# Patient Record
Sex: Female | Born: 1935 | Race: White | Marital: Married | Smoking: Never smoker
Health system: Southern US, Community
[De-identification: ages and names within clinical notes are randomized; demographics above are authoritative.]

## PROBLEM LIST (undated history)

## (undated) DIAGNOSIS — E785 Hyperlipidemia, unspecified: Secondary | ICD-10-CM

## (undated) DIAGNOSIS — M5416 Radiculopathy, lumbar region: Secondary | ICD-10-CM

## (undated) DIAGNOSIS — I10 Essential (primary) hypertension: Secondary | ICD-10-CM

## (undated) DIAGNOSIS — K589 Irritable bowel syndrome without diarrhea: Secondary | ICD-10-CM

## (undated) HISTORY — DX: Radiculopathy, lumbar region: M54.16

## (undated) HISTORY — DX: Hyperlipidemia, unspecified: E78.5

## (undated) HISTORY — DX: Essential (primary) hypertension: I10

## (undated) HISTORY — DX: Irritable bowel syndrome, unspecified: K58.9

---

## 2015-10-10 ENCOUNTER — Ambulatory Visit (INDEPENDENT_AMBULATORY_CARE_PROVIDER_SITE_OTHER): Payer: Medicare Other | Admitting: Gastroenterology

## 2015-10-10 ENCOUNTER — Other Ambulatory Visit (INDEPENDENT_AMBULATORY_CARE_PROVIDER_SITE_OTHER): Payer: Medicare Other

## 2015-10-10 ENCOUNTER — Encounter: Payer: Self-pay | Admitting: Gastroenterology

## 2015-10-10 VITALS — BP 130/66 | HR 76 | Ht 63.0 in | Wt 105.0 lb

## 2015-10-10 DIAGNOSIS — Z8719 Personal history of other diseases of the digestive system: Secondary | ICD-10-CM

## 2015-10-10 DIAGNOSIS — R109 Unspecified abdominal pain: Secondary | ICD-10-CM | POA: Diagnosis not present

## 2015-10-10 LAB — CBC WITH DIFFERENTIAL/PLATELET
BASOS ABS: 0 10*3/uL (ref 0.0–0.1)
BASOS PCT: 0.4 % (ref 0.0–3.0)
Eosinophils Absolute: 0.2 10*3/uL (ref 0.0–0.7)
Eosinophils Relative: 2.2 % (ref 0.0–5.0)
HEMATOCRIT: 36.8 % (ref 36.0–46.0)
Hemoglobin: 12.2 g/dL (ref 12.0–15.0)
LYMPHS PCT: 30.6 % (ref 12.0–46.0)
Lymphs Abs: 2.1 10*3/uL (ref 0.7–4.0)
MCHC: 33.2 g/dL (ref 30.0–36.0)
MCV: 90.4 fl (ref 78.0–100.0)
MONOS PCT: 7.6 % (ref 3.0–12.0)
Monocytes Absolute: 0.5 10*3/uL (ref 0.1–1.0)
NEUTROS ABS: 4.1 10*3/uL (ref 1.4–7.7)
Neutrophils Relative %: 59.2 % (ref 43.0–77.0)
PLATELETS: 181 10*3/uL (ref 150.0–400.0)
RBC: 4.07 Mil/uL (ref 3.87–5.11)
RDW: 14.2 % (ref 11.5–15.5)
WBC: 6.9 10*3/uL (ref 4.0–10.5)

## 2015-10-10 LAB — COMPREHENSIVE METABOLIC PANEL
ALT: 15 U/L (ref 0–35)
AST: 17 U/L (ref 0–37)
Albumin: 4.4 g/dL (ref 3.5–5.2)
Alkaline Phosphatase: 52 U/L (ref 39–117)
BUN: 16 mg/dL (ref 6–23)
CHLORIDE: 102 meq/L (ref 96–112)
CO2: 31 meq/L (ref 19–32)
CREATININE: 0.76 mg/dL (ref 0.40–1.20)
Calcium: 9.9 mg/dL (ref 8.4–10.5)
GFR: 77.43 mL/min (ref >60.00)
Glucose, Bld: 101 mg/dL — ABNORMAL HIGH (ref 70–99)
POTASSIUM: 4.3 meq/L (ref 3.5–5.1)
SODIUM: 139 meq/L (ref 135–145)
Total Bilirubin: 0.5 mg/dL (ref 0.2–1.2)
Total Protein: 6.9 g/dL (ref 6.0–8.3)

## 2015-10-10 LAB — IGA: IgA: 244 mg/dL (ref 68–378)

## 2015-10-10 MED ORDER — HYOSCYAMINE SULFATE ER 0.375 MG PO TB12: 0.3750 mg | ORAL_TABLET | Freq: Two times a day (BID) | ORAL | Status: DC

## 2015-10-10 NOTE — Patient Instructions (Addendum)
Start levbid .0375mg  one pill twice daily. You will have labs checked today in the basement lab.  Please head down after you check out with the front desk  (cbc, cmet, tTG level, total IgA). Ranitidine 75mg  pill, one pill at bedtime. Please return to see Dr. Christella HartiganJacobs on 12/11/15 at 8:45 am, sooner if needed.

## 2015-10-10 NOTE — Progress Notes (Signed)
HPI: This is a    very pleasant 82 year old woman whom I meeting for the first time today.  Chief complaint is chronic abdominal pains  abd pains. Periumbilical.  Burning and sometimes shooting pains.  More on her left side, especially under left ribs.  These pains have been going on at least 25-30 years.    Daughter is here with her.    She has eating problems.  Cannot help broccoilli, cauliflower, turnups.  Her weight has been down a bit.  Had the flu last year.  HTN in November.  She has had colonoscopy many years ago.  EGD also many years (10-11 years).  She has BMs without much trouble.  Never seen blood in her stool.  No colon cancer in her family.  Takes ranitidine PRN for the pains and it usually helps.  75mg  dosing works well.  Daughter says she can only eat very plain foods, no fried foods.  Her diet is very limited.  She brought US   She was told she had IBS a long time ago.  05/2015 US essentially normal (gallblader was normal).  Review of systems: Pertinent positive and negative review of systems were noted in the above HPI section. Complete review of systems was performed and was otherwise normal.   History reviewed. No pertinent past medical history.  History reviewed. No pertinent past surgical history.  Current Outpatient Prescriptions  Medication Sig Dispense Refill  . atorvastatin (LIPITOR) 10 MG tablet     . Calcium-Magnesium-Zinc 167-83-8 MG TABS Take by mouth daily.    . carvedilol (COREG) 12.5 MG tablet     . Cholecalciferol (VITAMIN D3) 5000 units CAPS Take 1 capsule by mouth daily.    . clonazePAM (KLONOPIN) 0.5 MG tablet      No current facility-administered medications for this visit.    Allergies as of 10/10/2015 - Review Complete 10/10/2015  Allergen Reaction Noted  . Aspirin Shortness Of Breath 10/10/2015  . Penicillins Swelling 10/10/2015    History reviewed. No pertinent family history.  Social History   Social History   . Marital Status: Married    Spouse Name: N/A  . Number of Children: 2  . Years of Education: N/A   Occupational History  . Retired    Social History Main Topics  . Smoking status: Never Smoker   . Smokeless tobacco: Never Used  . Alcohol Use: No  . Drug Use: No  . Sexual Activity: Not on file   Other Topics Concern  . Not on file   Social History Narrative  . No narrative on file     Physical Exam: BP 130/66 mmHg  Pulse 76  Ht 5\' 3"  (1.6 m)  Wt 105 lb (47.628 kg)  BMI 18.60 kg/m2 Constitutional: generally well-appearing Psychiatric: alert and oriented x3 Eyes: extraocular movements intact Mouth: oral pharynx moist, no lesions Neck: supple no lymphadenopathy Cardiovascular: heart regular rate and rhythm Lungs: clear to auscultation bilaterally Abdomen: soft, nontender, nondistended, no obvious ascites, no peritoneal signs, normal bowel sounds Extremities: no lower extremity edema bilaterally Skin: no lesions on visible extremities   Assessment and plan: 82 y.o. female with  Chronic abdominal pains, 25-30 years  She has had IBS-like pains with some potential GERD overlap for the past 25-30 years.   Sublingual antispasmodics seem to help very well. H2 blockers can help as well. I recommended some modifications to her medicines. She is going to begin twice daily pill form antispasmodic medicine. She is going to change her  H2 blocker to nightly dosing rather than when necessary only. We'll get a basic set of labs to a clinic CBC, complete about profile, celiac sprue testing. She will return to see me in 2-3 months and sooner if needed.   Rob Bunting, MD [facility] 10/10/2015, 1:37 PM

## 2015-10-11 LAB — TISSUE TRANSGLUTAMINASE, IGA: Tissue Transglutaminase Ab, IgA: 1 U/mL (ref <4)

## 2015-10-12 ENCOUNTER — Telehealth: Payer: Self-pay | Admitting: Gastroenterology

## 2015-10-12 NOTE — Telephone Encounter (Signed)
Pt aware to try miralax daily and adjust accordingly.  Pt agreed and will call if she has further problems

## 2015-10-17 ENCOUNTER — Telehealth: Payer: Self-pay | Admitting: Gastroenterology

## 2015-10-17 NOTE — Telephone Encounter (Signed)
She should stop the levbid.

## 2015-10-17 NOTE — Telephone Encounter (Signed)
Wants to try another medication. Understands that she may have the same side effects. What do you recommend?

## 2015-10-17 NOTE — Telephone Encounter (Signed)
Daughter states the side effects of the Levebid for her mother are burning tongue and an extremely dry mouth. She "has pain in her stomach to the point that she passes out". It is felt the Levebid does not make the pain of her IBS less. And she does not like the side effects.

## 2015-10-18 ENCOUNTER — Other Ambulatory Visit: Payer: Self-pay

## 2015-10-18 ENCOUNTER — Telehealth: Payer: Self-pay | Admitting: Gastroenterology

## 2015-10-18 MED ORDER — CILIDINIUM-CHLORDIAZEPOXIDE 2.5-5 MG PO CAPS: 1.0000 | ORAL_CAPSULE | Freq: Three times a day (TID) | ORAL | Status: DC

## 2015-10-18 NOTE — Telephone Encounter (Signed)
The pharmacy has been notified that generic is ok to dispence.

## 2015-10-18 NOTE — Telephone Encounter (Signed)
She can try librax 1 cap po qid before meals and at bedtime.  Disp 120, 3 refills.  Thanks

## 2015-10-18 NOTE — Telephone Encounter (Signed)
Discussed with the daughter. Willing to try the medication. [facility]

## 2015-12-11 ENCOUNTER — Ambulatory Visit: Payer: Medicare Other | Admitting: Gastroenterology

## 2015-12-19 ENCOUNTER — Ambulatory Visit: Payer: Medicare Other | Admitting: Gastroenterology

## 2018-05-01 ENCOUNTER — Other Ambulatory Visit: Payer: Self-pay | Admitting: Neurosurgery

## 2018-05-05 ENCOUNTER — Encounter: Payer: Self-pay | Admitting: Neurology

## 2018-05-05 ENCOUNTER — Telehealth: Payer: Self-pay | Admitting: Neurology

## 2018-05-05 ENCOUNTER — Ambulatory Visit (INDEPENDENT_AMBULATORY_CARE_PROVIDER_SITE_OTHER): Payer: Medicare Other | Admitting: Neurology

## 2018-05-05 VITALS — Ht 63.0 in | Wt 104.0 lb

## 2018-05-05 DIAGNOSIS — G459 Transient cerebral ischemic attack, unspecified: Secondary | ICD-10-CM | POA: Diagnosis not present

## 2018-05-05 DIAGNOSIS — M5416 Radiculopathy, lumbar region: Secondary | ICD-10-CM | POA: Insufficient documentation

## 2018-05-05 DIAGNOSIS — E782 Mixed hyperlipidemia: Secondary | ICD-10-CM

## 2018-05-05 DIAGNOSIS — K588 Other irritable bowel syndrome: Secondary | ICD-10-CM

## 2018-05-05 DIAGNOSIS — K589 Irritable bowel syndrome without diarrhea: Secondary | ICD-10-CM | POA: Insufficient documentation

## 2018-05-05 DIAGNOSIS — E785 Hyperlipidemia, unspecified: Secondary | ICD-10-CM | POA: Insufficient documentation

## 2018-05-05 DIAGNOSIS — R7309 Other abnormal glucose: Secondary | ICD-10-CM

## 2018-05-05 DIAGNOSIS — I10 Essential (primary) hypertension: Secondary | ICD-10-CM | POA: Diagnosis not present

## 2018-05-05 NOTE — Progress Notes (Signed)
[facility]    Provider:  Dr Lucia Gaskins Referring Provider: Bedelia Person, MD, Donalee Citrin MD Primary Care Physician:  Bedelia Person, MD  CC:  Monocular vision loss  HPI:  Ann Davidson is a 84 y.o. female here as requested by Dr. Wynetta Emery for monocular vision loss. She is here with her family who provides much information. PHx HTN, IBS, HLD 3 weeks ago she was at the breakfast table and she was just sitting there and she lost vision in her left eye. It was a white plate with zig zags and then her whole vision went black and jagged edges. No history of migraines. She has had vertiginous problems. ASA causes shortness of breath. The episode lasted 15 minutes but not sure. She couldn't see her husband face. Only in the left eye. She called her eye doctor, she just had a check up. She was seen immediately by the eye doctor. No other focal neurologic deficits, associated symptoms, inciting events or modifiable factors.  Reviewed notes, labs and imaging from outside physicians, which showed:  Reviewed doppler study and values, < 50% carotid stenosis bilaterally.   Will request Dr. Lonie Peak notes  Review of Systems: Patient complains of symptoms per HPI as well as the following symptoms: memory loss, dizziness, numbness. Pertinent negatives and positives per HPI. All others negative.   Social History   Socioeconomic History  . Marital status: Married    Spouse name: Not on file  . Number of children: 2  . Years of education: 51  . Highest education level: High school graduate  Occupational History  . Occupation: Retired  Engineer, production  . Financial resource strain: Not on file  . Food insecurity:    Worry: Not on file    Inability: Not on file  . Transportation needs:    Medical: Not on file    Non-medical: Not on file  Tobacco Use  . Smoking status: Never Smoker  . Smokeless tobacco: Never Used  Substance and Sexual Activity  . Alcohol use: No    Alcohol/week: 0.0  standard drinks  . Drug use: No  . Sexual activity: Not on file  Lifestyle  . Physical activity:    Days per week: Not on file    Minutes per session: Not on file  . Stress: Not on file  Relationships  . Social connections:    Talks on phone: Not on file    Gets together: Not on file    Attends religious service: Not on file    Active member of club or organization: Not on file    Attends meetings of clubs or organizations: Not on file    Relationship status: Not on file  . Intimate partner violence:    Fear of current or ex partner: Not on file    Emotionally abused: Not on file    Physically abused: Not on file    Forced sexual activity: Not on file  Other Topics Concern  . Not on file  Social History Narrative   Lives at home with her husband    Right handed   Caffeine: decaf tea & coffee    Family History  Problem Relation Age of Onset  . Heart attack Mother   . Heart attack Brother        problem with anesthesia (colon never "woke up")  . Other Brother        problem with anesthesia   . Cancer Brother  stomach    Past Medical History:  Diagnosis Date  . HLD (hyperlipidemia)   . Hypertension   . Irritable bowel syndrome   . Lumbar radiculopathy     History reviewed. No pertinent surgical history.  Current Outpatient Medications  Medication Sig Dispense Refill  . acetaminophen (TYLENOL) 500 MG tablet Take 1,000 mg by mouth 2 (two) times daily as needed for moderate pain.    Marland Kitchen acetaminophen-codeine (TYLENOL #3) 300-30 MG tablet Take 1 tablet by mouth at bedtime as needed for moderate pain.    Marland Kitchen amLODipine (NORVASC) 5 MG tablet Take 5 mg by mouth daily as needed (if bp is over 170).   3  . atorvastatin (LIPITOR) 10 MG tablet Take 10 mg by mouth daily.     . calcium carbonate (TUMS - DOSED IN MG ELEMENTAL CALCIUM) 500 MG chewable tablet Chew 2 tablets by mouth 2 (two) times daily as needed for indigestion or heartburn.    . carvedilol (COREG) 25 MG  tablet Take 12.5 mg by mouth 2 (two) times daily.    . famotidine (PEPCID AC) 10 MG tablet Take 10 mg by mouth daily as needed for heartburn or indigestion.    . hyoscyamine (ANASPAZ) 0.125 MG TBDP disintergrating tablet Take 0.125 mg by mouth 2 (two) times daily as needed for spasms.  2  . Multiple Vitamins-Minerals (PRESERVISION AREDS PO) Take 1 tablet by mouth 2 (two) times daily.    Marland Kitchen trolamine salicylate (ASPERCREME) 10 % cream Apply 1 application topically as needed for muscle pain.     No current facility-administered medications for this visit.     Allergies as of 05/05/2018 - Review Complete 05/05/2018  Allergen Reaction Noted  . Aspirin Shortness Of Breath 10/10/2015  . Codeine  05/05/2018  . Cyclobenzaprine  05/05/2018  . Penicillins Swelling 10/10/2015    Vitals: Ht 5\' 3"  (1.6 m)   Wt 104 lb (47.2 kg)   BMI 18.42 kg/m  Last Weight:  Wt Readings from Last 1 Encounters:  05/05/18 104 lb (47.2 kg)   Last Height:   Ht Readings from Last 1 Encounters:  05/05/18 5\' 3"  (1.6 m)    Physical exam: Exam: Gen: NAD, conversant, thin                    CV: RRR, no MRG. No Carotid Bruits. No peripheral edema, warm, nontender Eyes: Conjunctivae clear without exudates or hemorrhage  Neuro: Detailed Neurologic Exam  Speech:    Speech is normal; fluent and spontaneous with normal comprehension.  Cognition:    The patient is oriented to person, place, and time;     recent and remote memory impaired;     language fluent;     Impaired attention, concentration, fund of knowledge Cranial Nerves:    The pupils are equal, round, and reactive to light. Visual fields are full to finger confrontation. Extraocular movements are intact. Trigeminal sensation is intact and the muscles of mastication are normal. The face is symmetric. The palate elevates in the midline. Hearing intact. Voice is normal. Shoulder shrug is normal. The tongue has normal motion without fasciculations.    Coordination:    No dysmetria noted  Gait:    Deferred due to fall risk  Motor Observation:    No asymmetry, no atrophy, and no involuntary movements noted. Tone:    Normal muscle tone.    Posture:    Posture is normal in wheel chair    Assessment/Plan:  84 year old with  likely TIA vs stroke, monocular temporary vision loss  Recommend a stroke workup: MRI of the brain and MRA head to evaluate for TIA vs Stroke (TIA/Stroke is high on the differential but need a thorough workup for all other causes) Recommend a daily baby asa Recommend fasting lipid panel with goal LDL < 70 (she has eaten today, advised to discuss with pcp she may have had a recent lipid panel she doesn;t know) Labs today Try to call Dr. Wynetta Emeryram this week to discuss, sent him an email (she has surgery planned next wed, its unlikely stroke workup will be complete by then) She has an increased risk of stroke/TIA in the next 6-12 weeks, we recommend delaying any procedures during this time but also discussed risk vs benefit 9she is in extreme pain). Advised they also call Dr. Wynetta Emeryram to discuss.   I had a long d/w patient about her recent TIA vs Stroke, risk for recurrent stroke/TIAs, personally independently reviewed imaging studies and stroke evaluation results and answered questions. Start ASA for secondary stroke prevention and maintain strict control of hypertension with blood pressure goal below 130/90, diabetes with hemoglobin A1c goal below 6.5% and lipids with LDL cholesterol goal below 70 mg/dL. I also advised the patient to eat a healthy diet with plenty of whole grains, cereals, fruits and vegetables, exercise regularly and maintain ideal body weight   Orders Placed This Encounter  Procedures  . MR BRAIN WO CONTRAST  . MR MRA HEAD WO CONTRAST  . CBC  . Comprehensive metabolic panel  . Hemoglobin A1c  . Sedimentation rate  . C-reactive protein    Naomie DeanAntonia Axiel Fjeld, MD  [facility] [address] [place], [place] [id number]  Phone [phone] Fax [phone]  A total of 60 minutes was spent face-to-face with this patient. Over half this time was spent on counseling patient on the  1. TIA (transient ischemic attack)   2. Lumbar radiculopathy   3. Other irritable bowel syndrome   4. Essential hypertension   5. Mixed hyperlipidemia   6. Elevated glucose     diagnosis and different diagnostic and therapeutic options, counseling and coordination of care, risks ans benefits of management, compliance, or risk factor reduction and education.

## 2018-05-05 NOTE — Telephone Encounter (Signed)
Call dr Wynetta Emerycram

## 2018-05-05 NOTE — Patient Instructions (Addendum)
Recommend a stroke workup: MRI of the brain and MRA head to evaluate for strokes TIA vs Stroke Recommend a daily baby asa Recommend fasting lipid panel with goal LDL < 70 Labs today Try to call Dr. Wynetta Davidson today   Transient Ischemic Attack A transient ischemic attack (TIA) is a "warning stroke" that causes stroke-like symptoms. A TIA does not cause lasting damage to the brain. The symptoms of a TIA can happen fast and do not last long. It is important to know the symptoms of a TIA and what to do. This can help prevent stroke or death. Follow these instructions at home:  Take medicines only as told by your doctor. Make sure you understand all of the instructions.  You may need to take aspirin or warfarin medicine. Warfarin needs to be taken exactly as told. ? Taking too much or too little warfarin is dangerous. Blood tests must be done as often as told by your doctor. A PT blood test measures how long it takes for blood to clot. Your PT is used to calculate another value called an INR. Your PT and INR help your doctor adjust your warfarin dosage. He or she will make sure you are taking the right amount. ? Food can cause problems with warfarin and affect the results of your blood tests. This is true for foods high in vitamin K. Eat the same amount of foods high in vitamin K each day. Foods high in vitamin K include spinach, kale, broccoli, cabbage, collard and turnip greens, Brussels sprouts, peas, cauliflower, seaweed, and parsley. Other foods high in vitamin K include beef and pork liver, green tea, and soybean oil. Eat the same amount of foods high in vitamin K each day. Avoid big changes in your diet. Tell your doctor before changing your diet. Talk to a food specialist (dietitian) if you have questions. ? Many medicines can cause problems with warfarin and affect your PT and INR. Tell your doctor about all medicines you take. This includes vitamins and dietary pills (supplements). Do not take or  stop taking any prescribed or over-the-counter medicines unless your doctor tells you to. ? Warfarin can cause more bruising or bleeding. Hold pressure over any cuts for longer than normal. Talk to your doctor about other side effects of warfarin. ? Avoid sports or activities that may cause injury or bleeding. ? Be careful when you shave, floss, or use sharp objects. ? Avoid or drink very little alcohol while taking warfarin. Tell your doctor if you change how much alcohol you drink. ? Tell your dentist and other doctors that you take warfarin before any procedures.  Follow your diet program as told, if you are given one.  Keep a healthy weight.  Stay active. Try to get at least 30 minutes of activity on all or most days.  Do not use any tobacco products, including cigarettes, chewing tobacco, or electronic cigarettes. If you need help quitting, ask your doctor.  Limit alcohol intake to no more than 1 drink per day for nonpregnant women and 2 drinks per day for men. One drink equals 12 ounces of beer, 5 ounces of wine, or 1 ounces of hard liquor.  Do not abuse drugs.  Keep your home safe so you do not fall. You can do this by: ? Putting grab bars in the bedroom and bathroom. ? Raising toilet seats. ? Putting a seat in the shower.  Keep all follow-up visits as told by your doctor. This is important. Contact a  doctor if:  Your personality changes.  You have trouble swallowing.  You have double vision.  You are dizzy.  You have a fever. Get help right away if: These symptoms may be an emergency. Do not wait to see if the symptoms will go away. Get medical help right away. Call your local emergency services (911 in the [place] Do not drive yourself to the hospital.  You have sudden weakness or lose feeling (go numb), especially on one side of the body. This can affect your: ? Face. ? Arm. ? Leg.  You have sudden trouble walking.  You have sudden trouble moving your arms or  legs.  You have sudden confusion.  You have trouble talking.  You have trouble understanding.  You have sudden trouble seeing in one or both eyes.  You lose your balance.  Your movements are not smooth.  You have a sudden, very bad headache with no known cause.  You have new chest pain.  Your heartbeat is unsteady.  You are partly or totally unaware of what is going on around you.  This information is not intended to replace advice given to you by your health care provider. Make sure you discuss any questions you have with your health care provider. Document Released: 03/05/2008 Document Revised: 01/29/2016 Document Reviewed: 09/01/2013 Elsevier Interactive Patient Education  [employer]2018 Elsevier Inc.

## 2018-05-05 NOTE — Telephone Encounter (Signed)
Stanton KidneyDebra, can you get me Dr. Lonie Peakram's office notes for this patient please, neurosurgery, hopefully if you can by Wednesday please? if you can, thanks

## 2018-05-06 ENCOUNTER — Telehealth: Payer: Self-pay | Admitting: *Deleted

## 2018-05-06 LAB — COMPREHENSIVE METABOLIC PANEL
A/G RATIO: 2.4 — AB (ref 1.2–2.2)
ALBUMIN: 4.6 g/dL (ref 3.5–4.7)
ALT: 23 IU/L (ref 0–32)
AST: 19 IU/L (ref 0–40)
Alkaline Phosphatase: 67 IU/L (ref 39–117)
BILIRUBIN TOTAL: 0.8 mg/dL (ref 0.0–1.2)
BUN / CREAT RATIO: 23 (ref 12–28)
BUN: 16 mg/dL (ref 8–27)
CALCIUM: 9.8 mg/dL (ref 8.7–10.3)
CHLORIDE: 97 mmol/L (ref 96–106)
CO2: 24 mmol/L (ref 20–29)
Creatinine, Ser: 0.69 mg/dL (ref 0.57–1.00)
GFR, EST AFRICAN AMERICAN: 92 mL/min/{1.73_m2} (ref >59)
GFR, EST NON AFRICAN AMERICAN: 80 mL/min/{1.73_m2} (ref >59)
GLOBULIN, TOTAL: 1.9 g/dL (ref 1.5–4.5)
Glucose: 107 mg/dL — ABNORMAL HIGH (ref 65–99)
POTASSIUM: 5.1 mmol/L (ref 3.5–5.2)
SODIUM: 139 mmol/L (ref 134–144)
TOTAL PROTEIN: 6.5 g/dL (ref 6.0–8.5)

## 2018-05-06 LAB — HEMOGLOBIN A1C
Est. average glucose Bld gHb Est-mCnc: 114 mg/dL
Hgb A1c MFr Bld: 5.6 % (ref 4.8–5.6)

## 2018-05-06 LAB — CBC
HEMATOCRIT: 38.7 % (ref 34.0–46.6)
Hemoglobin: 13 g/dL (ref 11.1–15.9)
MCH: 30.8 pg (ref 26.6–33.0)
MCHC: 33.6 g/dL (ref 31.5–35.7)
MCV: 92 fL (ref 79–97)
PLATELETS: 303 10*3/uL (ref 150–450)
RBC: 4.22 x10E6/uL (ref 3.77–5.28)
RDW: 12.4 % (ref 12.3–15.4)
WBC: 8.4 10*3/uL (ref 3.4–10.8)

## 2018-05-06 LAB — SEDIMENTATION RATE: SED RATE: 2 mm/h (ref 0–40)

## 2018-05-06 LAB — C-REACTIVE PROTEIN: CRP: <1 mg/L (ref 0–10)

## 2018-05-06 NOTE — Telephone Encounter (Signed)
Spoke with pt's daughter Roanna RaiderSherri and gave her the message from Dr. Lucia GaskinsAhern. She verbalized understanding and appreciation. As of now she is still on standby and has not started the Aspirin until she hears back.

## 2018-05-06 NOTE — Telephone Encounter (Signed)
R/c  notes from Dr Wynetta Emeryram office, notes on [name].

## 2018-05-06 NOTE — Telephone Encounter (Signed)
I called Dr. Wynetta Emeryram, he is in surgery but I spoke with his nurse and and forwarded my note to his nurse. Left my cell phone number. Please let family know he is aware thanks

## 2018-05-06 NOTE — Telephone Encounter (Addendum)
Called pt's daughter Ann Davidson (on [place]) and discussed lab results as listed below. She verbalized understanding, her questions were answered. She verbalized appreciation.  ----- Message from Melvenia Beam, MD sent at 05/06/2018  8:46 AM EST ----- Labs look fine. HgbA1c normal, she is not diabetic.Sed and ESR normal so no suspicion for temporal arteritis as we discussed. thanks

## 2018-05-08 NOTE — Pre-Procedure Instructions (Signed)
Ann BellmanIda Davidson  05/08/2018      [id number] - [address], [place] - [address] [place] [id number] Phone: [phone] Fax: [phone]    Your procedure is scheduled on Wednesday, December 4th.  [facility] at [facility]:30 [facility].M.  Call this number if you have problems the morning of surgery:  [phone]   Remember:  Do not eat or drink after midnight.    Take these medicines the morning of surgery with A SIP OF WATER  acetaminophen (TYLENOL) - if needed amLODipine (NORVASC)-if needed atorvastatin (LIPITOR) carvedilol (COREG) famotidine (PEPCID AC) - if needed  As of today, STOP taking any Aspirin(unless otherwise instructed by your surgeon), Aleve, Naproxen, Ibuprofen, Motrin, Advil, Goody's, BC's, all herbal medications, fish oil, and all vitamins    Do not wear jewelry, make-up or nail polish.  Do not wear lotions, powders, or perfumes, or deodorant.  Do not shave 48 hours prior to surgery.    Do not bring valuables to the hospital.  [facility] is not responsible for any belongings or valuables.  Eyeglasses, contacts, hearing aids, dentures or bridgework may not be worn into surgery.  Leave your suitcase in the car.  After surgery it may be brought to your room.  For patients admitted to the hospital, discharge time will be determined by your treatment team.  Patients discharged the day of surgery will not be allowed to drive home.   Special instructions:   [facility]- Preparing For Surgery  Before surgery, you can play an important role. Because skin is not sterile, your skin needs to be as free of germs as possible. You can reduce the number of germs on your skin by washing with CHG (chlorahexidine gluconate) Soap before surgery.  CHG is an antiseptic cleaner which kills germs and bonds with the skin to continue killing germs even after washing.    Oral Hygiene is also important to reduce your risk of  infection.  Remember - BRUSH YOUR TEETH THE MORNING OF SURGERY WITH YOUR REGULAR TOOTHPASTE  Please do not use if you have an allergy to CHG or antibacterial soaps. If your skin becomes reddened/irritated stop using the CHG.  Do not shave (including legs and underarms) for at least 48 hours prior to first CHG shower. It is OK to shave your face.  Please follow these instructions carefully.   1. Shower the NIGHT BEFORE SURGERY and the MORNING OF SURGERY with CHG.   2. If you chose to wash your hair, wash your hair first as usual with your normal shampoo.  3. After you shampoo, rinse your hair and body thoroughly to remove the shampoo.  4. Use CHG as you would any other liquid soap. You can apply CHG directly to the skin and wash gently with a scrungie or a clean washcloth.   5. Apply the CHG Soap to your body ONLY FROM THE NECK DOWN.  Do not use on open wounds or open sores. Avoid contact with your eyes, ears, mouth and genitals (private parts). Wash Face and genitals (private parts)  with your normal soap.  6. Wash thoroughly, paying special attention to the area where your surgery will be performed.  7. Thoroughly rinse your body with warm water from the neck down.  8. DO NOT shower/wash with your normal soap after using and rinsing off the CHG Soap.  9. Pat yourself dry with a CLEAN TOWEL.  10. Wear CLEAN PAJAMAS  to bed the night before surgery, wear comfortable clothes the morning of surgery  11. Place CLEAN SHEETS on your bed the night of your first shower and DO NOT SLEEP WITH PETS.    Day of Surgery:  Do not apply any deodorants/lotions.  Please wear clean clothes to the hospital/surgery center.   Remember to brush your teeth WITH YOUR REGULAR TOOTHPASTE.  Please read over the following fact sheets that you were given.

## 2018-05-11 ENCOUNTER — Other Ambulatory Visit: Payer: Self-pay

## 2018-05-11 ENCOUNTER — Encounter (HOSPITAL_COMMUNITY)
Admission: RE | Admit: 2018-05-11 | Discharge: 2018-05-11 | Disposition: A | Discharge status: Home / Self Care | Payer: Medicare Other | Source: Ambulatory Visit | Attending: Neurosurgery | Admitting: Neurosurgery

## 2018-05-11 DIAGNOSIS — I10 Essential (primary) hypertension: Secondary | ICD-10-CM | POA: Insufficient documentation

## 2018-05-11 DIAGNOSIS — Z79899 Other long term (current) drug therapy: Secondary | ICD-10-CM | POA: Insufficient documentation

## 2018-05-11 DIAGNOSIS — Z01812 Encounter for preprocedural laboratory examination: Secondary | ICD-10-CM

## 2018-05-11 DIAGNOSIS — M5126 Other intervertebral disc displacement, lumbar region: Secondary | ICD-10-CM

## 2018-05-11 DIAGNOSIS — K589 Irritable bowel syndrome without diarrhea: Secondary | ICD-10-CM

## 2018-05-11 DIAGNOSIS — E785 Hyperlipidemia, unspecified: Secondary | ICD-10-CM | POA: Insufficient documentation

## 2018-05-11 LAB — SURGICAL PCR SCREEN
MRSA, PCR: NEGATIVE
Staphylococcus aureus: NEGATIVE

## 2018-05-11 NOTE — Progress Notes (Signed)
PCP - Dr. Fanny Dancearen Aaron Cardiologist - Dr. Enrigue CatenaBoshra Davidson   Chest x-ray - N/A EKG - requested  Stress Test - 2016-resulted noted in care everywhere under encounter summary.  ECHO - denies Cardiac Cath - denies  Sleep Study - denies  Aspirin Instructions: Pt stated that per Dr. Wynetta Emeryram, she is to start 81mg  of ASA today and continue until DOS.   Anesthesia review: Hyman HopesYes, Allison, PA consulted with pt at PAT appointment.   Patient denies shortness of breath, fever, cough and chest pain at PAT appointment   Patient verbalized understanding of instructions that were given to them at the PAT appointment. Patient was also instructed that they will need to review over the PAT instructions again at home before surgery.

## 2018-05-11 NOTE — Progress Notes (Signed)
Anesthesia PAT Evaluation:   Case:  [id number] Date/Time:  05/13/18 1724   Procedure:  Microdiscectomy - L4-L5 - left (Left Back)   Anesthesia type:  General   Pre-op diagnosis:  HNP   Location:  [facility] OR ROOM 20 / [place]   Surgeon:  Kary Kos, MD      DISCUSSION: Patient is an 84 year old female scheduled for the above procedure. Patient was evaluated during her 05/11/18 PAT visit. Her husband, daughter Venida Jarvis [id number]), and son-in-law were also present.   History includes never smoker, IBS, HLD, HTN, lumbar radiculopathy, transient left monocular vision loss 04/12/18.  ANESTHESIA QUESTIONS/CONCERNS: Patient has had limited anesthesia except for childhood tonsillectomy and sedation for endoscopy procedures. She denied known anesthesia complications, but Dr. Saintclair Halsted recommended preoperative anesthesia consult given the death of two of her siblings within days/weeks of surgery. He, patient, and family wanted to make sure anesthesiologists were not concerned about familial anesthesia complications such as malignant hyperthermia.   - Patient and family (information primarily obtained from daughter Venida Jarvis) reported that patient's brother died about three years ago following what sounds like colon resection in [place]. Shortly after surgery family witnessed him awake and alert and walking and talking, but about 12 hours later he developed oliguria/anuria. Dialysis was not successful, and he developed volume overload and had a cardiac event. He was placed on a ventilator and died about a week later once support withdrawn. They are unsure of his underlying medical conditions. The hospital would not release his records to family without signed release. - Patient's sister died 18-Jul-2017 after a fall with hip fracture followed by hip replacement surgery. She was discharged to a nursing facility in [place], but developed what sounds like an ileus and failure to progress and died within a month of surgery.     -  In regards to her transient left monocular vision loss on 04/12/18, she said she contacted her eye doctor who reportedly did not find an issue with her eye itself, but referred her for a carotid Duplex which showed < 50% BICA stenosis. Head CT on 04/25/18 showed no acute findings. (She thought she saw ENT Mattie Marlin, MD more recently, but staff there reported last visit was 12/2016.) When symptoms discussed with Dr. Saintclair Halsted, he referred her to neurologist Sarina Ill, MD.   Dr. Jaynee Eagles was concerned that patient had suffered a TIA or small CVA, as she did not have a history of migraines and labs showed no suspicion for temporal arteritis given normal sed rate and ESR. She recommended patient undergo MRI of the brain and MRA of the head, fasting lipid panel (if not done recently by PCP), and start daily ASA. Ideally, Dr. Jaynee Eagles felt surgery should be delayed 6-12 weeks since at higher risk for stroke/TIA during this time frame. This would also allow more time to complete stroke work-up. However, urgency of surgery would need to be taken into consideration, so Dr. Jaynee Eagles wished to speak with Dr. Saintclair Halsted and recommended that patient discuss further with him as well. According to Lorriane Shire at Dr. Windy Carina office, he did speak with Dr. Jaynee Eagles 05/11/18 and reportedly, surgery will remain as scheduled unless patient is not willing to accept her potential increased perioperative stroke risk given incomplete neurology work-up for transient monocular vision loss. Patient's daughter Venida Jarvis says Dr. Saintclair Halsted called and spoke with her about his conversation with Dr. Jaynee Eagles. Patient and her family are still considering their options. She is apparently at risk for left foot drop without  surgery, and at increased stroke if surgery performed within the next eight weeks. Sherri said that Dr. Saintclair Halsted has asked her to go ahead and start ASA 81 mg (as recommended by Dr. Jaynee Eagles), but not to take on the morning of surgery if she decides to proceed as  scheduled.     - Dr. Saintclair Halsted also requested cardiology clearance from Delanna Notice, MD. He saw her on 04/28/18 for preoperative evaluation. There was mention of a stress echo (for atypical chest pain), although seemed more patient driven. In the end, he wrote, "Patient is scheduled to have surgery, fairly symptomatic.  From the cardiovascular standpoint she seems to be stable, there is no modifiable issues at this point that will affect her outcome.  In regards to the carotid disease, she has less than 50% stenosis, which is stable, she did have an issue with her vision that evaluated by Ophthalmology.  Vision is back to normal at this point." (I did call Dr. Milta Deiters office to clarify mention of stress echo. I spoke with Claiborne Billings. She noted that stress echo was actually discussed at an October visit, and was cancelled and not reordered at her follow-up visit with Dr. Rosalita Chessman. Patient had an EKG at her 04/28/18 visit, but otherwise he did not order additional testing.)  Case initially discussed with anesthesiologist Hoy Morn, MD on 05/11/18 and later with Renold Don, MD on 05/12/18 after additional records received and additional phone calls with patient's daughter Venida Jarvis.   - Dr. Gifford Shave agreed that patient's siblings' deaths weeks/months after surgery did not sound typical for malignant hyperthermia type reaction. Patient and daughter encouraged to also discuss with assigned anesthesiologist on the day of surgery.  - As of the afternoon 05/12/18, Sherri says current plan is to proceed with surgery as scheduled. Patient's activity has been severely limited for about the past month. She has had injections for back pain in the past, but had rather abrupt onset of current symptoms a few weeks ago. Sherri has also reached out to Dr. Jaynee Eagles again to see if any additional input following her conversation with Dr. Saintclair Halsted. He is having patient stay on perioperative ASA in hopes to decreased perioperative stroke risk.  Patient also got fasting lipid panel at her PCP office on 05/12/18 with plans to forward results to PAT and Dr. Jaynee Eagles once available.  - Patient's surgery not scheduled until ~ 5 PM. Patient and family concerned about NPO status after MN--particularly because with IBS patient only eats small meals and tends to not feel week and have nausea if without food for prolonged amount of time. Discussed with Dr. Fransisco Beau. She can have light (non-fatty) meal up to 7:00 AM. She can have clear liquids up to 12:00 PM. I reviewed specific examples with patient's daughter Venida Jarvis and also discussed plan with Lorriane Shire at Dr. Windy Carina office.  Assigned anesthesiologist to evaluate on the day of surgery and discuss definitive anesthesia plan.   VS: BP 140/69   Pulse 69   Temp 36.7 C   Resp 20   Ht '5\' 3"'  (1.6 m)   Wt 47.5 kg   SpO2 100%   BMI 18.55 kg/m     PROVIDERS: Joseph Art, MD is PCP ([facility]) - Sarina Ill, MD is neurologist. Last visit 05/05/18 for monocular vision loss. She was referred by Dr. Saintclair Halsted. See Discussion. Mattie Marlin, MD is ENT. Delanna Notice, MD is cardiologist [facility]).    LABS: Labs on 05/05/18 included: Lab Results  Component Value Date   WBC 8.4 05/05/2018   HGB 13.0 05/05/2018   HCT 38.7 05/05/2018   PLT 303 05/05/2018   GLUCOSE 107 (H) 05/05/2018   ALT 23 05/05/2018   AST 19 05/05/2018   NA 139 05/05/2018   K 5.1 05/05/2018   CL 97 05/05/2018   CREATININE 0.69 05/05/2018   BUN 16 05/05/2018   CO2 24 05/05/2018   HGBA1C 5.6 05/05/2018  CRP < 1. Total globulin 1.9. Sed rate 2.   IMAGES:  CT Head 04/25/18 ([facility]): FINDINGS: No acute intracranial hemorrhage, mass effect or midline shift identified.  There is mild periventricular white matter disease with global atrophy and ex vacuo dilatation of the CSF spaces.  No abnormal extra-axial fluid collections.  The paranasal sinuses and mastoid air cells are  air-filled.  The globes and retrobulbar spaces appear intact.  The bones of the calvarium are intact.  Overlying soft tissue appear relatively intact. IMPRESSION: No acute intracranial findings.   EKG: 04/28/18 [facility]): SR with rate variation. Poor r wave progression V1-4. Mildly negative T waves in septal leads and aVL.    CV: According to Dr. Milta Deiters and Dr. Cathren Laine notes, patient had a carotid U/S 04/2018 that showed < 50% BICA stenosis. (Copy not yet received from [facility].)  Echo 07/07/15 [facility]): Conclusions: 1. Normal left ventricular size, wall thickness, wall motion, and function. EF 60-65%. 2. Normal diastolic function. 3. There is moderate pulmonary hypertension with an estimated systolic PA pressure of 18-56 mmHg. 4. Mild aortic insufficiency, mitral regurgitation, tricuspid regurgitation, and pulmonic insufficiency.   [facility]  05/18/15 "NUC MED STRESS TEST EXERCISE/PHARMACOLOGIC AS APPROPRIATE CONCLUSIONS: 1. No significant symptoms or ECG changes after exercise. Poor exercise tolerance. 2. The post stress ejection fraction is measured at 88%. 3. Normal myocardial perfusion study without evidence of inducible ischemia."   Past Medical History:  Diagnosis Date  . HLD (hyperlipidemia)   . Hypertension   . Irritable bowel syndrome   . Lumbar radiculopathy     No past surgical history on file.  MEDICATIONS: . acetaminophen (TYLENOL) 500 MG tablet  . acetaminophen-codeine (TYLENOL #3) 300-30 MG tablet  . amLODipine (NORVASC) 5 MG tablet  . atorvastatin (LIPITOR) 10 MG tablet  . calcium carbonate (TUMS - DOSED IN MG ELEMENTAL CALCIUM) 500 MG chewable tablet  . carvedilol (COREG) 25 MG tablet  . famotidine (PEPCID AC) 10 MG tablet  . hyoscyamine (ANASPAZ) 0.125 MG TBDP disintergrating tablet  . Multiple Vitamins-Minerals (PRESERVISION AREDS PO)  . trolamine salicylate  (ASPERCREME) 10 % cream   No current facility-administered medications for this encounter.   Patient to start ASA 81 mg daily.    [facility]/Anesthesiology Phone [phone] 05/12/2018 6:01 PM

## 2018-05-12 ENCOUNTER — Telehealth: Payer: Self-pay | Admitting: Neurology

## 2018-05-12 NOTE — Telephone Encounter (Signed)
Ann Davidson;  She had a TIA or stroke. Dr. Wynetta Emeryram said she can start asa 81mg . However as we discussed she is at increased risk for stroke. thanks

## 2018-05-12 NOTE — Telephone Encounter (Signed)
Pt daughter(on DPR-Duarte,Sherri) is asking for a call from Dr Lucia GaskinsAhern.  Daughter states she has concerns she'd like to run by Dr Lucia GaskinsAhern before pt's back surgery tomorrow afternoon.  Please call

## 2018-05-12 NOTE — Anesthesia Preprocedure Evaluation (Addendum)
Anesthesia Evaluation  Patient identified by MRN, date of birth, ID band Patient awake    Reviewed: Allergy & Precautions, NPO status , Patient's Chart, lab work & pertinent test results, reviewed documented beta blocker date and time   History of Anesthesia Complications Negative for: history of anesthetic complications  Airway Mallampati: II  TM Distance: >3 FB Neck ROM: Full    Dental  (+) Dental Advisory Given   Pulmonary neg pulmonary ROS,    breath sounds clear to auscultation       Cardiovascular hypertension, Pt. on medications and Pt. on home beta blockers (-) angina Rhythm:Regular Rate:Normal  '16 Stress: No significant symptoms or ECG changes after exercise. Poor exercise tolerance. post stress EF 88%. Normal myocardial perfusion study without evidence of inducible ischemia.    Neuro/Psych TIA (recent TIA, on ASA)negative psych ROS   GI/Hepatic Neg liver ROS, GERD  Controlled,  Endo/Other  negative endocrine ROS  Renal/GU negative Renal ROS     Musculoskeletal   Abdominal   Peds  Hematology negative hematology ROS (+)   Anesthesia Other Findings   Reproductive/Obstetrics                           Anesthesia Physical Anesthesia Plan  ASA: III  Anesthesia Plan: General   Post-op Pain Management:    Induction: Intravenous  PONV Risk Score and Plan: 3 and Ondansetron, Dexamethasone and Treatment may vary due to age or medical condition  Airway Management Planned: Oral ETT  Additional Equipment:   Intra-op Plan:   Post-operative Plan: Extubation in OR  Informed Consent: I have reviewed the patients History and Physical, chart, labs and discussed the procedure including the risks, benefits and alternatives for the proposed anesthesia with the patient or authorized representative who has indicated his/her understanding and acceptance.   Dental advisory given  Plan  Discussed with:   Anesthesia Plan Comments: (Please review my PAT note regarding anesthesia concerns and recent neurology input. Patient seemed overwhelmed after discussing her siblings' anesthesia/surgical history--so preference is for anesthesiologist to talk with daughter Roanna RaiderSherri first if additional anesthesia details needed. Shonna ChockAllison Zelenak, PA-C)       Anesthesia Quick Evaluation

## 2018-05-13 ENCOUNTER — Encounter (HOSPITAL_COMMUNITY): Payer: Self-pay | Admitting: Certified Registered Nurse Anesthetist

## 2018-05-13 ENCOUNTER — Inpatient Hospital Stay (HOSPITAL_COMMUNITY): Payer: Medicare Other | Admitting: Emergency Medicine

## 2018-05-13 ENCOUNTER — Inpatient Hospital Stay (HOSPITAL_COMMUNITY): Payer: Medicare Other | Admitting: Certified Registered Nurse Anesthetist

## 2018-05-13 ENCOUNTER — Inpatient Hospital Stay (HOSPITAL_COMMUNITY)
Admission: RE | Admit: 2018-05-13 | Discharge: 2018-05-15 | DRG: 520 | Disposition: A | Discharge status: Home Health | Payer: Medicare Other | Attending: Neurosurgery | Admitting: Neurosurgery

## 2018-05-13 ENCOUNTER — Inpatient Hospital Stay (HOSPITAL_COMMUNITY)
Admission: RE | Disposition: A | Discharge status: Home Health | Payer: Self-pay | Source: Home / Self Care | Attending: Neurosurgery

## 2018-05-13 ENCOUNTER — Inpatient Hospital Stay (HOSPITAL_COMMUNITY): Payer: Medicare Other

## 2018-05-13 DIAGNOSIS — M21379 Foot drop, unspecified foot: Secondary | ICD-10-CM | POA: Diagnosis present

## 2018-05-13 DIAGNOSIS — Z886 Allergy status to analgesic agent status: Secondary | ICD-10-CM | POA: Diagnosis not present

## 2018-05-13 DIAGNOSIS — M549 Dorsalgia, unspecified: Secondary | ICD-10-CM | POA: Diagnosis present

## 2018-05-13 DIAGNOSIS — K589 Irritable bowel syndrome without diarrhea: Secondary | ICD-10-CM | POA: Diagnosis present

## 2018-05-13 DIAGNOSIS — Z885 Allergy status to narcotic agent status: Secondary | ICD-10-CM | POA: Diagnosis not present

## 2018-05-13 DIAGNOSIS — M5416 Radiculopathy, lumbar region: Secondary | ICD-10-CM | POA: Diagnosis present

## 2018-05-13 DIAGNOSIS — Z880 Allergy status to penicillin: Secondary | ICD-10-CM

## 2018-05-13 DIAGNOSIS — M47816 Spondylosis without myelopathy or radiculopathy, lumbar region: Secondary | ICD-10-CM | POA: Diagnosis present

## 2018-05-13 DIAGNOSIS — Z888 Allergy status to other drugs, medicaments and biological substances status: Secondary | ICD-10-CM

## 2018-05-13 DIAGNOSIS — Z79899 Other long term (current) drug therapy: Secondary | ICD-10-CM

## 2018-05-13 DIAGNOSIS — Z419 Encounter for procedure for purposes other than remedying health state, unspecified: Secondary | ICD-10-CM

## 2018-05-13 DIAGNOSIS — M5126 Other intervertebral disc displacement, lumbar region: Secondary | ICD-10-CM | POA: Diagnosis present

## 2018-05-13 DIAGNOSIS — Z8673 Personal history of transient ischemic attack (TIA), and cerebral infarction without residual deficits: Secondary | ICD-10-CM

## 2018-05-13 DIAGNOSIS — I10 Essential (primary) hypertension: Secondary | ICD-10-CM | POA: Diagnosis present

## 2018-05-13 DIAGNOSIS — M48061 Spinal stenosis, lumbar region without neurogenic claudication: Principal | ICD-10-CM | POA: Diagnosis present

## 2018-05-13 DIAGNOSIS — E785 Hyperlipidemia, unspecified: Secondary | ICD-10-CM | POA: Diagnosis present

## 2018-05-13 HISTORY — PX: LUMBAR LAMINECTOMY/DECOMPRESSION MICRODISCECTOMY: SHX5026

## 2018-05-13 SURGERY — LUMBAR LAMINECTOMY/DECOMPRESSION MICRODISCECTOMY 1 LEVEL
Anesthesia: General | Site: Back | Laterality: Left

## 2018-05-13 MED ORDER — ROCURONIUM BROMIDE 50 MG/5ML IV SOSY
PREFILLED_SYRINGE | INTRAVENOUS | Status: AC
  Filled 2018-05-13: qty 5

## 2018-05-13 MED ORDER — PANTOPRAZOLE SODIUM 40 MG IV SOLR
40.0000 mg | Freq: Every day | INTRAVENOUS | Status: DC
  Administered 2018-05-13: 40 mg via INTRAVENOUS
  Filled 2018-05-13: qty 40

## 2018-05-13 MED ORDER — BUPIVACAINE HCL (PF) 0.25 % IJ SOLN
INTRAMUSCULAR | Status: DC | PRN
  Administered 2018-05-13: 10 mL

## 2018-05-13 MED ORDER — ONDANSETRON HCL 4 MG PO TABS: 4.0000 mg | ORAL_TABLET | Freq: Four times a day (QID) | ORAL | Status: DC | PRN

## 2018-05-13 MED ORDER — ROCURONIUM BROMIDE 50 MG/5ML IV SOSY
PREFILLED_SYRINGE | INTRAVENOUS | Status: DC | PRN
  Administered 2018-05-13: 50 mg via INTRAVENOUS

## 2018-05-13 MED ORDER — FENTANYL CITRATE (PF) 250 MCG/5ML IJ SOLN
INTRAMUSCULAR | Status: AC
  Filled 2018-05-13: qty 5

## 2018-05-13 MED ORDER — HEMOSTATIC AGENTS (NO CHARGE) OPTIME
TOPICAL | Status: DC | PRN
  Administered 2018-05-13: 1 via TOPICAL

## 2018-05-13 MED ORDER — LIDOCAINE-EPINEPHRINE 1 %-1:100000 IJ SOLN
INTRAMUSCULAR | Status: AC
  Filled 2018-05-13: qty 1

## 2018-05-13 MED ORDER — ONDANSETRON HCL 4 MG/2ML IJ SOLN
INTRAMUSCULAR | Status: AC
  Filled 2018-05-13: qty 4

## 2018-05-13 MED ORDER — THROMBIN 5000 UNITS EX SOLR
CUTANEOUS | Status: DC | PRN
  Administered 2018-05-13 (×2): 5000 [IU] via TOPICAL

## 2018-05-13 MED ORDER — VANCOMYCIN HCL IN DEXTROSE 750-5 MG/150ML-% IV SOLN
750.0000 mg | Freq: Once | INTRAVENOUS | Status: AC
  Administered 2018-05-14: 750 mg via INTRAVENOUS
  Filled 2018-05-13: qty 150

## 2018-05-13 MED ORDER — THROMBIN (RECOMBINANT) 5000 UNITS EX SOLR
CUTANEOUS | Status: AC
  Filled 2018-05-13: qty 10000

## 2018-05-13 MED ORDER — VANCOMYCIN HCL 1000 MG IV SOLR
INTRAVENOUS | Status: DC | PRN
  Administered 2018-05-13: 1000 mg via INTRAVENOUS

## 2018-05-13 MED ORDER — PHENYLEPHRINE 40 MCG/ML (10ML) SYRINGE FOR IV PUSH (FOR BLOOD PRESSURE SUPPORT)
PREFILLED_SYRINGE | INTRAVENOUS | Status: DC | PRN
  Administered 2018-05-13 (×2): 80 ug via INTRAVENOUS

## 2018-05-13 MED ORDER — ACETAMINOPHEN 650 MG RE SUPP: 650.0000 mg | RECTAL | Status: DC | PRN

## 2018-05-13 MED ORDER — ONDANSETRON HCL 4 MG/2ML IJ SOLN
INTRAMUSCULAR | Status: DC | PRN
  Administered 2018-05-13: 4 mg via INTRAVENOUS

## 2018-05-13 MED ORDER — ALUM & MAG HYDROXIDE-SIMETH 200-200-20 MG/5ML PO SUSP: 30.0000 mL | Freq: Four times a day (QID) | ORAL | Status: DC | PRN

## 2018-05-13 MED ORDER — LIDOCAINE-EPINEPHRINE 1 %-1:100000 IJ SOLN
INTRAMUSCULAR | Status: DC | PRN
  Administered 2018-05-13: 9 mL

## 2018-05-13 MED ORDER — ACETAMINOPHEN 10 MG/ML IV SOLN
INTRAVENOUS | Status: AC
  Filled 2018-05-13: qty 100

## 2018-05-13 MED ORDER — ACETAMINOPHEN 10 MG/ML IV SOLN
INTRAVENOUS | Status: DC | PRN
  Administered 2018-05-13: 1000 mg via INTRAVENOUS

## 2018-05-13 MED ORDER — BUPIVACAINE HCL (PF) 0.25 % IJ SOLN
INTRAMUSCULAR | Status: AC
  Filled 2018-05-13: qty 30

## 2018-05-13 MED ORDER — FENTANYL CITRATE (PF) 100 MCG/2ML IJ SOLN: 25.0000 ug | INTRAMUSCULAR | Status: DC | PRN

## 2018-05-13 MED ORDER — DEXAMETHASONE SODIUM PHOSPHATE 10 MG/ML IJ SOLN
INTRAMUSCULAR | Status: AC
  Filled 2018-05-13: qty 1

## 2018-05-13 MED ORDER — ATORVASTATIN CALCIUM 10 MG PO TABS
10.0000 mg | ORAL_TABLET | Freq: Every day | ORAL | Status: DC
  Administered 2018-05-14 – 2018-05-15 (×2): 10 mg via ORAL
  Filled 2018-05-13 (×2): qty 1

## 2018-05-13 MED ORDER — HYDROMORPHONE HCL 1 MG/ML IJ SOLN: 0.5000 mg | INTRAMUSCULAR | Status: DC | PRN

## 2018-05-13 MED ORDER — LACTATED RINGERS IV SOLN
INTRAVENOUS | Status: DC
  Administered 2018-05-13 – 2018-05-14 (×2): via INTRAVENOUS

## 2018-05-13 MED ORDER — SUGAMMADEX SODIUM 200 MG/2ML IV SOLN
INTRAVENOUS | Status: DC | PRN
  Administered 2018-05-13: 200 mg via INTRAVENOUS

## 2018-05-13 MED ORDER — SODIUM CHLORIDE 0.9 % IV SOLN: 250.0000 mL | INTRAVENOUS | Status: DC

## 2018-05-13 MED ORDER — CYCLOBENZAPRINE HCL 10 MG PO TABS: 10.0000 mg | ORAL_TABLET | Freq: Three times a day (TID) | ORAL | Status: DC | PRN

## 2018-05-13 MED ORDER — FENTANYL CITRATE (PF) 100 MCG/2ML IJ SOLN
INTRAMUSCULAR | Status: DC | PRN
  Administered 2018-05-13 (×2): 50 ug via INTRAVENOUS

## 2018-05-13 MED ORDER — 0.9 % SODIUM CHLORIDE (POUR BTL) OPTIME
TOPICAL | Status: DC | PRN
  Administered 2018-05-13: 1000 mL

## 2018-05-13 MED ORDER — PROPOFOL 10 MG/ML IV BOLUS
INTRAVENOUS | Status: AC
  Filled 2018-05-13: qty 20

## 2018-05-13 MED ORDER — PROPOFOL 10 MG/ML IV BOLUS
INTRAVENOUS | Status: DC | PRN
  Administered 2018-05-13: 120 mg via INTRAVENOUS
  Administered 2018-05-13: 50 mg via INTRAVENOUS

## 2018-05-13 MED ORDER — CARVEDILOL 12.5 MG PO TABS
12.5000 mg | ORAL_TABLET | Freq: Two times a day (BID) | ORAL | Status: DC
  Administered 2018-05-13 – 2018-05-15 (×4): 12.5 mg via ORAL
  Filled 2018-05-13 (×4): qty 1

## 2018-05-13 MED ORDER — SODIUM CHLORIDE 0.9 % IV SOLN
INTRAVENOUS | Status: DC | PRN
  Administered 2018-05-13: 25 ug/min via INTRAVENOUS

## 2018-05-13 MED ORDER — FAMOTIDINE 20 MG PO TABS: 10.0000 mg | ORAL_TABLET | Freq: Every day | ORAL | Status: DC | PRN

## 2018-05-13 MED ORDER — ACETAMINOPHEN 500 MG PO TABS: 1000.0000 mg | ORAL_TABLET | Freq: Two times a day (BID) | ORAL | Status: DC | PRN

## 2018-05-13 MED ORDER — OXYCODONE HCL 5 MG PO TABS
10.0000 mg | ORAL_TABLET | ORAL | Status: DC | PRN
  Administered 2018-05-14: 10 mg via ORAL
  Filled 2018-05-13: qty 2

## 2018-05-13 MED ORDER — LIDOCAINE 2% (20 MG/ML) 5 ML SYRINGE
INTRAMUSCULAR | Status: AC
  Filled 2018-05-13: qty 5

## 2018-05-13 MED ORDER — ACETAMINOPHEN-CODEINE #3 300-30 MG PO TABS: 1.0000 | ORAL_TABLET | Freq: Every evening | ORAL | Status: DC | PRN

## 2018-05-13 MED ORDER — SODIUM CHLORIDE 0.9% FLUSH
3.0000 mL | Freq: Two times a day (BID) | INTRAVENOUS | Status: DC
  Administered 2018-05-13: 3 mL via INTRAVENOUS

## 2018-05-13 MED ORDER — VANCOMYCIN HCL IN DEXTROSE 1-5 GM/200ML-% IV SOLN
INTRAVENOUS | Status: AC
  Filled 2018-05-13: qty 200

## 2018-05-13 MED ORDER — PHENYLEPHRINE 40 MCG/ML (10ML) SYRINGE FOR IV PUSH (FOR BLOOD PRESSURE SUPPORT)
PREFILLED_SYRINGE | INTRAVENOUS | Status: AC
  Filled 2018-05-13: qty 20

## 2018-05-13 MED ORDER — MENTHOL 3 MG MT LOZG: 1.0000 | LOZENGE | OROMUCOSAL | Status: DC | PRN

## 2018-05-13 MED ORDER — ACETAMINOPHEN 325 MG PO TABS
650.0000 mg | ORAL_TABLET | ORAL | Status: DC | PRN
  Administered 2018-05-14 – 2018-05-15 (×4): 650 mg via ORAL
  Filled 2018-05-13 (×4): qty 2

## 2018-05-13 MED ORDER — CALCIUM CARBONATE ANTACID 500 MG PO CHEW: 2.0000 | CHEWABLE_TABLET | Freq: Two times a day (BID) | ORAL | Status: DC | PRN

## 2018-05-13 MED ORDER — ONDANSETRON HCL 4 MG/2ML IJ SOLN: 4.0000 mg | Freq: Four times a day (QID) | INTRAMUSCULAR | Status: DC | PRN

## 2018-05-13 MED ORDER — AMLODIPINE BESYLATE 5 MG PO TABS
5.0000 mg | ORAL_TABLET | Freq: Every day | ORAL | Status: DC | PRN
  Administered 2018-05-13: 5 mg via ORAL
  Filled 2018-05-13: qty 1

## 2018-05-13 MED ORDER — SODIUM CHLORIDE 0.9 % IV SOLN
INTRAVENOUS | Status: DC | PRN
  Administered 2018-05-13: 18:00:00

## 2018-05-13 MED ORDER — PROSIGHT PO TABS
1.0000 | ORAL_TABLET | Freq: Two times a day (BID) | ORAL | Status: DC
  Administered 2018-05-13 – 2018-05-15 (×4): 1 via ORAL
  Filled 2018-05-13 (×4): qty 1

## 2018-05-13 MED ORDER — PHENOL 1.4 % MT LIQD: 1.0000 | OROMUCOSAL | Status: DC | PRN

## 2018-05-13 MED ORDER — LIDOCAINE 2% (20 MG/ML) 5 ML SYRINGE
INTRAMUSCULAR | Status: DC | PRN
  Administered 2018-05-13: 60 mg via INTRAVENOUS

## 2018-05-13 MED ORDER — SODIUM CHLORIDE 0.9% FLUSH: 3.0000 mL | INTRAVENOUS | Status: DC | PRN

## 2018-05-13 MED ORDER — DEXAMETHASONE SODIUM PHOSPHATE 10 MG/ML IJ SOLN
INTRAMUSCULAR | Status: DC | PRN
  Administered 2018-05-13: 4 mg via INTRAVENOUS

## 2018-05-13 SURGICAL SUPPLY — 60 items
BAG DECANTER FOR FLEXI CONT (MISCELLANEOUS) ×3 IMPLANT
BENZOIN TINCTURE PRP APPL 2/3 (GAUZE/BANDAGES/DRESSINGS) ×3 IMPLANT
BLADE CLIPPER SURG (BLADE) IMPLANT
BLADE SURG 11 STRL SS (BLADE) ×3 IMPLANT
BUR CUTTER 7.0 ROUND (BURR) ×3 IMPLANT
BUR MATCHSTICK NEURO 3.0 LAGG (BURR) ×3 IMPLANT
CANISTER SUCT 3000ML PPV (MISCELLANEOUS) ×3 IMPLANT
CARTRIDGE OIL MAESTRO DRILL (MISCELLANEOUS) ×1 IMPLANT
CLOSURE STERI-STRIP 1/2X4 (GAUZE/BANDAGES/DRESSINGS) ×1
CLOSURE WOUND 1/2 X4 (GAUZE/BANDAGES/DRESSINGS) ×1
CLSR STERI-STRIP ANTIMIC 1/2X4 (GAUZE/BANDAGES/DRESSINGS) ×2 IMPLANT
COVER WAND RF STERILE (DRAPES) IMPLANT
DECANTER SPIKE VIAL GLASS SM (MISCELLANEOUS) ×6 IMPLANT
DERMABOND ADVANCED (GAUZE/BANDAGES/DRESSINGS) ×2
DERMABOND ADVANCED .7 DNX12 (GAUZE/BANDAGES/DRESSINGS) ×1 IMPLANT
DIFFUSER DRILL AIR PNEUMATIC (MISCELLANEOUS) ×3 IMPLANT
DRAPE HALF SHEET 40X57 (DRAPES) ×3 IMPLANT
DRAPE LAPAROTOMY 100X72X124 (DRAPES) ×3 IMPLANT
DRAPE MICROSCOPE LEICA (MISCELLANEOUS) ×3 IMPLANT
DRAPE SURG 17X23 STRL (DRAPES) ×3 IMPLANT
DRSG OPSITE POSTOP 4X6 (GAUZE/BANDAGES/DRESSINGS) ×3 IMPLANT
DURAPREP 26ML APPLICATOR (WOUND CARE) ×3 IMPLANT
ELECT REM PT RETURN 9FT ADLT (ELECTROSURGICAL) ×3
ELECTRODE REM PT RTRN 9FT ADLT (ELECTROSURGICAL) ×1 IMPLANT
GAUZE 4X4 16PLY RFD (DISPOSABLE) IMPLANT
GAUZE SPONGE 4X4 12PLY STRL (GAUZE/BANDAGES/DRESSINGS) IMPLANT
GLOVE BIO SURGEON STRL SZ 6.5 (GLOVE) ×6 IMPLANT
GLOVE BIO SURGEON STRL SZ7 (GLOVE) ×6 IMPLANT
GLOVE BIO SURGEON STRL SZ8 (GLOVE) ×3 IMPLANT
GLOVE BIO SURGEONS STRL SZ 6.5 (GLOVE) ×3
GLOVE BIOGEL PI IND STRL 6.5 (GLOVE) ×1 IMPLANT
GLOVE BIOGEL PI IND STRL 7.0 (GLOVE) ×1 IMPLANT
GLOVE BIOGEL PI IND STRL 7.5 (GLOVE) ×1 IMPLANT
GLOVE BIOGEL PI INDICATOR 6.5 (GLOVE) ×2
GLOVE BIOGEL PI INDICATOR 7.0 (GLOVE) ×2
GLOVE BIOGEL PI INDICATOR 7.5 (GLOVE) ×2
GLOVE EXAM NITRILE XL STR (GLOVE) IMPLANT
GLOVE INDICATOR 8.5 STRL (GLOVE) ×6 IMPLANT
GOWN STRL REUS W/ TWL LRG LVL3 (GOWN DISPOSABLE) ×1 IMPLANT
GOWN STRL REUS W/ TWL XL LVL3 (GOWN DISPOSABLE) ×1 IMPLANT
GOWN STRL REUS W/TWL 2XL LVL3 (GOWN DISPOSABLE) ×3 IMPLANT
GOWN STRL REUS W/TWL LRG LVL3 (GOWN DISPOSABLE) ×2
GOWN STRL REUS W/TWL XL LVL3 (GOWN DISPOSABLE) ×2
KIT BASIN OR (CUSTOM PROCEDURE TRAY) ×3 IMPLANT
KIT TURNOVER KIT B (KITS) ×3 IMPLANT
NEEDLE HYPO 22GX1.5 SAFETY (NEEDLE) ×3 IMPLANT
NEEDLE SPNL 22GX3.5 QUINCKE BK (NEEDLE) ×3 IMPLANT
NS IRRIG 1000ML POUR BTL (IV SOLUTION) ×3 IMPLANT
OIL CARTRIDGE MAESTRO DRILL (MISCELLANEOUS) ×3
PACK LAMINECTOMY NEURO (CUSTOM PROCEDURE TRAY) ×3 IMPLANT
RUBBERBAND STERILE (MISCELLANEOUS) ×6 IMPLANT
SPONGE SURGIFOAM ABS GEL SZ50 (HEMOSTASIS) ×3 IMPLANT
STRIP CLOSURE SKIN 1/2X4 (GAUZE/BANDAGES/DRESSINGS) ×2 IMPLANT
SUT VIC AB 0 CT1 18XCR BRD8 (SUTURE) ×1 IMPLANT
SUT VIC AB 0 CT1 8-18 (SUTURE) ×2
SUT VIC AB 2-0 CT1 18 (SUTURE) ×3 IMPLANT
SUT VICRYL 4-0 PS2 18IN ABS (SUTURE) ×3 IMPLANT
TOWEL GREEN STERILE (TOWEL DISPOSABLE) ×3 IMPLANT
TOWEL GREEN STERILE FF (TOWEL DISPOSABLE) ×3 IMPLANT
WATER STERILE IRR 1000ML POUR (IV SOLUTION) ×3 IMPLANT

## 2018-05-13 NOTE — Op Note (Signed)
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 Barnett AbuHenry Elsner  Anesthesia: Gen.  EBL: Minimal  History of present illness: Patient is very pleasant 84 year old female is a progress worsening back pain and left leg pain with a left-sided foot drop workup revealed large disc herniation with a free fragment migrating caudally displace the left L5 nerve root in this left L5 pedicle. She also has severe spinal stenosis that level. Due to patient's progression of clinical syndrome imaging findings of a conservative treatment I recommended laminectomy microdiscectomy at L4-5 on the left I extensively reviewed the risks and benefits of the operation with the patient as well as perioperative course expectations of outcome and alternatives surgery and she understood and agreed to proceed 4.  Operative procedure: Patient brought into the or was induced on general anesthesia positioned prone the Wilson frame her back was prepped and draped in routine sterile fashion. Preoperative x-ray localize the appropriate level. Then after infiltration of 10 mL lidocaine with epi a midline incision was made and Bovie light car was used to calcification subperiosteal dissections care lamina of what was believed the L4-5 however x-ray showed that the L3-4. Attention was taken one interspace below this and repeat x-ray was obtained to confirm. Then a high-speed drill was used to drill down the) aspect of lamina L4 medial facet complex super aspect of the lamina of L5 and laminotomy was begun ligament was identified and removed in piecemeal fashion decompressing the L4-5 disc space then under biting the medial gutter and extending down to the left L5  pedicle. I dissected left L5 nerve root off of a very large free fragment distal partially contained with ligament flavum was incised with lumbar scalpel and several large free fragments disc removed disc space is then incised and cleanout pituitary rongeurs. At the end the discectomy was no further stenosis no further fragments and the L5 nerve root explored inferior to the L5 pedicle with a coronary dilator confirmed. Wounds and to proceed irrigated meticulous he states was maintained Gelfoam was opened up the dura the muscle fascia proximal in layers with after Vicryl skin was closed running 4 subcuticular Dermabond benzo and Steri-Strips and sterile dressing was applied patient recovered in stable condition. At the end the case on it counts sponge counts were correct.

## 2018-05-13 NOTE — Telephone Encounter (Signed)
I spoke with daughter, discussed the risks of surgery such as stroke vs risks of postponing which would be permanent nerve injury and weakness with possibility of inability to ambulate. Daughter understands risks, patient would like to proceed with surgery

## 2018-05-13 NOTE — Transfer of Care (Signed)
Immediate Anesthesia Transfer of Care Note  Patient: Ann Davidson  Procedure(s) Performed: Microdiscectomy - L4-L5 - left (Left Back)  Patient Location: PACU  Anesthesia Type:General  Level of Consciousness: drowsy  Airway & Oxygen Therapy: Patient Spontanous Breathing  Post-op Assessment: Report given to RN and Post -op Vital signs reviewed and stable  Post vital signs: Reviewed and stable  Last Vitals:  Vitals Value Taken Time  BP 164/68 05/13/2018  7:37 PM  Temp    Pulse 86 05/13/2018  7:44 PM  Resp 22 05/13/2018  7:44 PM  SpO2 100 % 05/13/2018  7:44 PM  Vitals shown include unvalidated device data.  Last Pain:  Vitals:   05/13/18 1520  TempSrc: Oral         Complications: No apparent anesthesia complications

## 2018-05-13 NOTE — Progress Notes (Signed)
Pharmacy Antibiotic Note  Ann Davidson is a 84 y.o. female admitted on 05/13/2018 for spinal surgery. Pharmacy has been consulted for vancomycin dosing for post-op surgical prophylaxis.  Patient does not have a drain.    SCr 0.69, CrCL 38 ml/min.  Pre-op vancomycin dose was given around 1815.   Plan: Vanc 750mg  IV x 1 on 12/5 at 1700 Pharmacy will sign off   Height: 5\' 3"  (160 cm) Weight: 104 lb (47.2 kg) IBW/kg (Calculated) : 52.4  Temp (24hrs), Avg:98.2 F (36.8 C), Min:98.1 F (36.7 C), Max:98.4 F (36.9 C)  No results for input(s): WBC, CREATININE, LATICACIDVEN, VANCOTROUGH, VANCOPEAK, VANCORANDOM, GENTTROUGH, GENTPEAK, GENTRANDOM, TOBRATROUGH, TOBRAPEAK, TOBRARND, AMIKACINPEAK, AMIKACINTROU, AMIKACIN in the last 168 hours.  Estimated Creatinine Clearance: 39 mL/min (by C-G formula based on SCr of 0.69 mg/dL).    Allergies  Allergen Reactions  . Aspirin Shortness Of Breath  . Cyclobenzaprine     Mouth swollen, skin peeled, dry mouth  . Penicillins Swelling    Has patient had a PCN reaction causing immediate rash, facial/tongue/throat swelling, SOB or lightheadedness with hypotension: Unknown Has patient had a PCN reaction causing severe rash involving mucus membranes or skin necrosis: Unknown Has patient had a PCN reaction that required hospitalization: Unknown Has patient had a PCN reaction occurring within the last 10 years: No If all of the above answers are "NO", then may proceed with Cephalosporin use.   . Codeine     When taken before bed, causes confusion when woken up     [name], PharmD, BCPS, BCCCP 05/13/2018, 10:02 PM

## 2018-05-13 NOTE — Anesthesia Procedure Notes (Signed)
Procedure Name: Intubation Date/Time: 05/13/2018 6:14 PM Performed by: Candis Shine, CRNA Pre-anesthesia Checklist: Patient identified, Emergency Drugs available, Suction available and Patient being monitored Patient Re-evaluated:Patient Re-evaluated prior to induction Oxygen Delivery Method: Circle System Utilized Preoxygenation: Pre-oxygenation with 100% oxygen Induction Type: IV induction Ventilation: Mask ventilation without difficulty and Oral airway inserted - appropriate to patient size Laryngoscope Size: Mac and 3 Grade View: Grade III Tube type: Oral Tube size: 7.0 mm Number of attempts: 2 Airway Equipment and Method: Stylet and Oral airway Placement Confirmation: ETT inserted through vocal cords under direct vision,  positive ETCO2 and breath sounds checked- equal and bilateral Secured at: 21 cm Tube secured with: Tape Dental Injury: Teeth and Oropharynx as per pre-operative assessment  Comments: DL x 1 by CRNA, grade III view. DL x 1 by MDA, grade III view, able to successfully pass ETT.

## 2018-05-13 NOTE — H&P (Signed)
Ann Davidson is an 84 y.o. female.   Chief Complaint: back pain leg pain HPI: 84 year old female with over the last 3-4 weeks progress worsening back pain andleft leg pain rating down L5 nerve root pattern. Preoperative she had weakness on dorsiflexion workup revealed a large disc herniation at L4-5 with a free fragment migrating caudally this placed and the L5 nerve against L5 pedicle. Due to patient's progression of clinical syndrome imaging findings of a conservative treatment I recommended limiting microscopic discectomy at that level. I've extensively gone over the risks and benefits of the operation with her as well as perioperative course expectations of outcome and alternatives of surgery. She understands and agrees to proceed 4. We also talked about the fact that she probably had a TIA about 2-3 weeks ago was had her see a neurologist and they do think it was consistent with a TIA however during due to her severe intractable pain and footdrop we did not feel like we could afford to 12 weeks to stabilize her on aspirin so we did put her on aspirin preoperatively and I extensively went over all the risks of bleeding and stroke with the patient and family and they requested to proceed forward.  Past Medical History:  Diagnosis Date  . HLD (hyperlipidemia)   . Hypertension   . Irritable bowel syndrome   . Lumbar radiculopathy     History reviewed. No pertinent surgical history.  Family History  Problem Relation Age of Onset  . Heart attack Mother   . Heart attack Brother        problem with anesthesia (colon never "woke up")  . Other Brother        problem with anesthesia   . Cancer Brother        stomach   Social History:  reports that she has never smoked. She has never used smokeless tobacco. She reports that she does not drink alcohol or use drugs.  Allergies:  Allergies  Allergen Reactions  . Aspirin Shortness Of Breath  . Cyclobenzaprine     Mouth swollen, skin peeled, dry  mouth  . Penicillins Swelling    Has patient had a PCN reaction causing immediate rash, facial/tongue/throat swelling, SOB or lightheadedness with hypotension: Unknown Has patient had a PCN reaction causing severe rash involving mucus membranes or skin necrosis: Unknown Has patient had a PCN reaction that required hospitalization: Unknown Has patient had a PCN reaction occurring within the last 10 years: No If all of the above answers are "NO", then may proceed with Cephalosporin use.   . Codeine     When taken before bed, causes confusion when woken up    Medications Prior to Admission  Medication Sig Dispense Refill  . acetaminophen (TYLENOL) 500 MG tablet Take 1,000 mg by mouth 2 (two) times daily as needed for moderate pain.    Marland Kitchen. acetaminophen-codeine (TYLENOL #3) 300-30 MG tablet Take 1 tablet by mouth at bedtime as needed for moderate pain.    Marland Kitchen. amLODipine (NORVASC) 5 MG tablet Take 5 mg by mouth daily as needed (if bp is over 170).   3  . atorvastatin (LIPITOR) 10 MG tablet Take 10 mg by mouth daily.     . calcium carbonate (TUMS - DOSED IN MG ELEMENTAL CALCIUM) 500 MG chewable tablet Chew 2 tablets by mouth 2 (two) times daily as needed for indigestion or heartburn.    . carvedilol (COREG) 25 MG tablet Take 12.5 mg by mouth 2 (two) times daily.    .Marland Kitchen  famotidine (PEPCID AC) 10 MG tablet Take 10 mg by mouth daily as needed for heartburn or indigestion.    . hyoscyamine (ANASPAZ) 0.125 MG TBDP disintergrating tablet Take 0.125 mg by mouth 2 (two) times daily as needed for spasms.  2  . Multiple Vitamins-Minerals (PRESERVISION AREDS PO) Take 1 tablet by mouth 2 (two) times daily.    Marland Kitchen trolamine salicylate (ASPERCREME) 10 % cream Apply 1 application topically as needed for muscle pain.      No results found for this or any previous visit (from the past 48 hour(s)). No results found.  Review of Systems  Musculoskeletal: Positive for back pain.  Neurological: Positive for tingling and  sensory change.    Blood pressure (!) 181/51, pulse 63, temperature 98.1 F (36.7 C), temperature source Oral, resp. rate 18, height 5\' 3"  (1.6 m), weight 47.2 kg, SpO2 97 %. Physical Exam  Constitutional: She is oriented to person, place, and time. She appears well-developed and well-nourished.  HENT:  Head: Normocephalic.  Eyes: Pupils are equal, round, and reactive to light.  Neck: Normal range of motion.  Respiratory: Effort normal and breath sounds normal.  GI: Soft. Bowel sounds are normal.  Neurological: She is alert and oriented to person, place, and time. She has normal strength. GCS eye subscore is 4. GCS verbal subscore is 5. GCS motor subscore is 6.  Strength is 5 out of 5 iliopsoas, quads, his shoes, gastric, into tibialis and EHL except forleft foot drop  Skin: Skin is warm and dry.     Assessment/Plan 84 year old presents for left-sided lamina to microscopic discectomy L4-5  Ann Davidson P, MD 05/13/2018, 5:31 PM

## 2018-05-14 ENCOUNTER — Other Ambulatory Visit: Payer: Self-pay

## 2018-05-14 ENCOUNTER — Encounter (HOSPITAL_COMMUNITY): Payer: Self-pay | Admitting: Neurosurgery

## 2018-05-14 MED ORDER — POLYETHYLENE GLYCOL 3350 17 G PO PACK
17.0000 g | PACK | Freq: Every day | ORAL | Status: DC
  Administered 2018-05-14 – 2018-05-15 (×2): 17 g via ORAL
  Filled 2018-05-14 (×2): qty 1

## 2018-05-14 MED ORDER — PANTOPRAZOLE SODIUM 40 MG PO TBEC
40.0000 mg | DELAYED_RELEASE_TABLET | Freq: Every day | ORAL | Status: DC
  Administered 2018-05-14: 40 mg via ORAL
  Filled 2018-05-14: qty 1

## 2018-05-14 NOTE — Progress Notes (Signed)
Occupational Therapy Evaluation Patient Details Name: Ann Davidson MRN: [id number] DOB: 11-21-1931 Today's Date: 05/14/2018    History of Present Illness Patient is very pleasant 84 year old female is a progress worsening back pain and left leg pain with a left-sided foot drop workup revealed large disc herniation with a free fragment migrating caudally displace the left L5 nerve root in this left L5 pedicle. She also has severe spinal stenosis that level.   Lumbar laminectomy microdiscectomy L4-5 on the left with microdissection of left L5 nerve root and microscopic discectomy on 12/4.  PMH: IBS   Clinical Impression   PTA, husband assisted with ADLa dn mobility as needed due to back pain. Pt orthostatic and symptomatic during [facility] transfer. BP supine 120/48; sitting 108.48; standing 77/54; return to supine 122/52. Pt currently requires min A with mobility and mod A with ADL. Recommend follow up with HHOt to maximize independence and reduce risk of falls.     Follow Up Recommendations  Supervision/Assistance - 24 hour;No OT follow up    Equipment Recommendations  3 in 1 bedside commode    Recommendations for Other Services       Precautions / Restrictions Precautions Precautions: Fall;Back Precaution Booklet Issued: Yes (comment) Restrictions Weight Bearing Restrictions: No      Mobility Bed Mobility Overal bed mobility: Needs Assistance Bed Mobility: Rolling;Sidelying to Sit Rolling: Min guard Sidelying to sit: Min guard       General bed mobility comments: Pt needed cues for log roll.  Husband present and educated.  Transfers Overall transfer level: Needs assistance Equipment used: Rolling walker (2 wheeled);None;1 person hand held assist Transfers: Sit to/from Stand Sit to Stand: Min assist         General transfer comment: cues for hand placement, steadying assist as pt was slightly unsteady with sit to stand.  Pt reaching for furniture and therapist therefore  obtained RW for stability.    Balance Overall balance assessment: Needs assistance Sitting-balance support: No upper extremity supported;Feet supported Sitting balance-Leahy Scale: Fair     Standing balance support: No upper extremity supported;Bilateral upper extremity supported;During functional activity Standing balance-Leahy Scale: Poor Standing balance comment: relies on UE support and external support for balance.                            ADL either performed or assessed with clinical judgement   ADL Overall ADL's : Needs assistance/impaired     Grooming: Set up;Supervision/safety;Sitting   Upper Body Bathing: Set up;Supervision/ safety;Sitting   Lower Body Bathing: Moderate assistance;Sit to/from stand   Upper Body Dressing : Set up;Supervision/safety;Sitting   Lower Body Dressing: Moderate assistance;Sit to/from stand   Toilet Transfer: Minimal assistance;Stand-pivot;BSC   Toileting- Clothing Manipulation and Hygiene: Moderate assistance;Sit to/from stand       Functional mobility during ADLs: Minimal assistance;Cueing for safety;Cueing for sequencing       Vision         Perception     Praxis      Pertinent Vitals/Pain Pain Assessment: Faces Faces Pain Scale: Hurts little more Pain Location: back Pain Descriptors / Indicators: Aching;Grimacing;Guarding;Discomfort Pain Intervention(s): Limited activity within patient's tolerance     Hand Dominance Right   Extremity/Trunk Assessment Upper Extremity Assessment Upper Extremity Assessment: Generalized weakness   Lower Extremity Assessment Lower Extremity Assessment: Defer to PT evaluation   Cervical / Trunk Assessment Cervical / Trunk Assessment: Kyphotic   Communication Communication Communication: No difficulties   Cognition Arousal/Alertness:  Awake/alert Behavior During Therapy: WFL for tasks assessed/performed Overall Cognitive Status: Impaired/Different from baseline Area  of Impairment: Memory                               General Comments: cognition most likely affected by pain meds   General Comments       Exercises     Shoulder Instructions      Home Living Family/patient expects to be discharged to:: Private residence Living Arrangements: Spouse/significant other Available Help at Discharge: Family;Available 24 hours/day Type of Home: House Home Access: Stairs to enter Entergy CorporationEntrance Stairs-Number of Steps: 1 Entrance Stairs-Rails: None Home Layout: Able to live on main level with bedroom/bathroom     Bathroom Shower/Tub: Walk-in shower;Tub/shower unit   Teacher, early years/preBathroom Toilet: Standard Bathroom Accessibility: Yes How Accessible: Accessible via walker Home Equipment: Shower seat - built in;Cane - single point          Prior Functioning/Environment Level of Independence: Independent with assistive device(s);Needs assistance    ADL's / Homemaking Assistance Needed: husband was pushing pt into the bathroom on a rolling office chair to bath her   Comments: used cane PTA for last few weeks        OT Problem List: Decreased strength;Decreased activity tolerance;Impaired balance (sitting and/or standing);Decreased safety awareness;Decreased knowledge of use of DME or AE;Decreased knowledge of precautions;Pain      OT Treatment/Interventions: Self-care/ADL training;DME and/or AE instruction;Therapeutic activities;Patient/family education    OT Goals(Current goals can be found in the care plan section) Acute Rehab OT Goals Patient Stated Goal: to go home OT Goal Formulation: With patient Time For Goal Achievement: 05/28/18 Potential to Achieve Goals: Good  OT Frequency: Min 2X/week   Barriers to D/C:            Co-evaluation              AM-PAC OT "6 Clicks" Daily Activity     Outcome Measure Help from another person eating meals?: None Help from another person taking care of personal grooming?: A Little Help from  another person toileting, which includes using toliet, bedpan, or urinal?: A Little Help from another person bathing (including washing, rinsing, drying)?: A Little Help from another person to put on and taking off regular upper body clothing?: A Little Help from another person to put on and taking off regular lower body clothing?: A Little 6 Click Score: 19   End of Session Equipment Utilized During Treatment: Gait belt Nurse Communication: Mobility status  Activity Tolerance: Patient limited by fatigue;Treatment limited secondary to medical complications (Comment)(orthostatic) Patient left: in bed;with call bell/phone within reach;with nursing/sitter in room;with family/visitor present  OT Visit Diagnosis: Unsteadiness on feet (R26.81);Other abnormalities of gait and mobility (R26.89);Muscle weakness (generalized) (M62.81);Pain Pain - part of body: (back)                Time: [id number] OT Time Calculation (min): 19 min Charges:  OT General Charges $OT Visit: 1 Visit OT Evaluation $OT Eval Moderate Complexity: 1 Mod  Keeanna Villafranca, OT/L   Acute OT Clinical Specialist Acute Rehabilitation Services Pager [phone] Office [phone]   [facility] Spring HillWARD,HILLARY 05/14/2018, 5:50 PM

## 2018-05-14 NOTE — Evaluation (Signed)
Physical Therapy Evaluation Patient Details Name: Ann Davidson MRN: [id number] DOB: 1932/03/27 Today's Date: 05/14/2018   History of Present Illness  Patient is very pleasant 84 year old female is a progress worsening back pain and left leg pain with a left-sided foot drop workup revealed large disc herniation with a free fragment migrating caudally displace the left L5 nerve root in this left L5 pedicle. She also has severe spinal stenosis that level.   Lumbar laminectomy microdiscectomy L4-5 on the left with microdissection of left L5 nerve root and microscopic discectomy on 12/4.  PMH: IBS  Clinical Impression  Pt admitted with above diagnosis. Pt currently with functional limitations due to the deficits listed below (see PT Problem List). Pt was able to ambulate with RW in hallway with min assist and cues.  Pt needs cues for safety and husband to asssist pt at home.  Will follow acutely.  Pt will benefit from skilled PT to increase their independence and safety with mobility to allow discharge to the venue listed below.      Follow Up Recommendations Home health PT;Supervision/Assistance - 24 hour(safety eval recommended and then Outpt PT once healed)    Equipment Recommendations  Rolling walker with 5" wheels;3in1 (PT)(Will decide on rollator vs. RW tomorrow)    Recommendations for Other Services       Precautions / Restrictions Precautions Precautions: Fall;Back Precaution Booklet Issued: Yes (comment) Restrictions Weight Bearing Restrictions: No      Mobility  Bed Mobility Overal bed mobility: Needs Assistance Bed Mobility: Rolling;Sidelying to Sit Rolling: Min guard Sidelying to sit: Min guard       General bed mobility comments: Pt needed cues for log roll.  Husband present and educated.  Transfers Overall transfer level: Needs assistance Equipment used: Rolling walker (2 wheeled);None;1 person hand held assist Transfers: Sit to/from Stand Sit to Stand: Min assist          General transfer comment: cues for hand placement, steadying assist as pt was slightly unsteady with sit to stand.  Pt reaching for furniture and therapist therefore obtained RW for stability.  Ambulation/Gait Ambulation/Gait assistance: Min assist Gait Distance (Feet): 170 Feet(20 feet to bathroom, 150 feet) Assistive device: Rolling walker (2 wheeled);1 person hand held assist;None Gait Pattern/deviations: Step-to pattern;Decreased step length - right;Decreased step length - left;Decreased stride length;Decreased weight shift to right;Decreased weight shift to left;Shuffle;Antalgic;Drifts right/left;Trunk flexed   Gait velocity interpretation: 1.31 - 2.62 ft/sec, indicative of limited community ambulator General Gait Details: Initially pt ambulated to bathroom with no HHA and 1  HHA with pt generally unsteady needing min assist for support.  Obtained RW for incr stability. Constant cues to look forward as pt tends to look down at feet.  Pt needed cues for proximity to RW as well. Pt needed guidance of RW as well.    Stairs            Wheelchair Mobility    Modified Rankin (Stroke Patients Only)       Balance Overall balance assessment: Needs assistance Sitting-balance support: No upper extremity supported;Feet supported Sitting balance-Leahy Scale: Fair     Standing balance support: No upper extremity supported;Bilateral upper extremity supported;During functional activity Standing balance-Leahy Scale: Poor Standing balance comment: relies on UE support and external support for balance.                              Pertinent Vitals/Pain Pain Assessment: 0-10 Pain Score: 6  Pain  Location: back Pain Descriptors / Indicators: Aching;Grimacing;Guarding;Discomfort Pain Intervention(s): Limited activity within patient's tolerance;Monitored during session;Repositioned    Home Living Family/patient expects to be discharged to:: Private residence Living  Arrangements: Spouse/significant other Available Help at Discharge: Family;Available 24 hours/day Type of Home: House Home Access: Stairs to enter Entrance Stairs-Rails: None Entrance Stairs-Number of Steps: 1 Home Layout: Able to live on main level with bedroom/bathroom Home Equipment: Shower seat - built in;Cane - single point      Prior Function Level of Independence: Independent with assistive device(s)         Comments: used cane PTA for last few weeks     Hand Dominance   Dominant Hand: Right    Extremity/Trunk Assessment   Upper Extremity Assessment Upper Extremity Assessment: Defer to OT evaluation    Lower Extremity Assessment Lower Extremity Assessment: Generalized weakness    Cervical / Trunk Assessment Cervical / Trunk Assessment: Kyphotic  Communication   Communication: No difficulties  Cognition Arousal/Alertness: Awake/alert Behavior During Therapy: WFL for tasks assessed/performed Overall Cognitive Status: Within Functional Limits for tasks assessed                                        General Comments General comments (skin integrity, edema, etc.): Incr time educating pt regarding back precautions.  Pt and husband asked lots of questions.     Exercises     Assessment/Plan    PT Assessment Patient needs continued PT services  PT Problem List Decreased activity tolerance;Decreased balance;Decreased mobility;Decreased knowledge of use of DME;Decreased safety awareness;Decreased knowledge of precautions;Pain       PT Treatment Interventions DME instruction;Gait training;Functional mobility training;Stair training;Therapeutic activities;Therapeutic exercise;Balance training;Patient/family education    PT Goals (Current goals can be found in the Care Plan section)  Acute Rehab PT Goals Patient Stated Goal: to go home PT Goal Formulation: With patient Time For Goal Achievement: 05/28/18 Potential to Achieve Goals: Good     Frequency Min 5X/week   Barriers to discharge        Co-evaluation               AM-PAC PT "6 Clicks" Mobility  Outcome Measure Help needed turning from your back to your side while in a flat bed without using bedrails?: A Little Help needed moving from lying on your back to sitting on the side of a flat bed without using bedrails?: A Little Help needed moving to and from a bed to a chair (including a wheelchair)?: A Little Help needed standing up from a chair using your arms (e.g., wheelchair or bedside chair)?: A Little Help needed to walk in hospital room?: A Little Help needed climbing 3-5 steps with a railing? : A Little 6 Click Score: 18    End of Session Equipment Utilized During Treatment: Gait belt Activity Tolerance: Patient limited by fatigue;Patient limited by pain Patient left: in chair;with call bell/phone within reach;with family/visitor present Nurse Communication: Mobility status PT Visit Diagnosis: Unsteadiness on feet (R26.81);Muscle weakness (generalized) (M62.81);Pain Pain - part of body: (back)    Time: [id number] PT Time Calculation (min) (ACUTE ONLY): 58 min   Charges:   PT Evaluation $PT Eval Moderate Complexity: 1 Mod PT Treatments $Gait Training: 8-22 mins $Self Care/Home Management: 23-37        Abdi Husak,PT Acute Rehabilitation Services Pager:  [phone]  Office:  [phone]    Berline LopesDawn F Aerion Bagdasarian 05/14/2018,  10:48 AM

## 2018-05-14 NOTE — Progress Notes (Signed)
Subjective: Patient reports patient doing well with complete resolution of preoperative leg pain condition back pain  Objective: Vital signs in last 24 hours: Temp:  [97.9 F (36.6 C)-98.4 F (36.9 C)] 97.9 F (36.6 C) (12/05 0743) Pulse Rate:  [63-86] 82 (12/05 0743) Resp:  [15-25] 18 (12/05 0743) BP: (117-191)/(46-91) 145/57 (12/05 0743) SpO2:  [97 %-100 %] 100 % (12/05 0743) Weight:  [47.2 kg] 47.2 kg (12/04 1520)  Intake/Output from previous day: 12/04 0701 - 12/05 0700 In: 1054.2 [P.O.:400; I.V.:654.2] Out: 50 [Blood:50] Intake/Output this shift: No intake/output data recorded.  strength significantly improved improved left-sided foot drop incision clean dry and intact  Lab Results: No results for input(s): WBC, HGB, HCT, PLT in the last 72 hours. BMET No results for input(s): NA, K, CL, CO2, GLUCOSE, BUN, CREATININE, CALCIUM in the last 72 hours.  Studies/Results: Dg Lumbar Spine 2-3 Views  Result Date: 05/13/2018 CLINICAL DATA:  Localization for L4-5 microdiscectomy EXAM: LUMBAR SPINE - 2-3 VIEW COMPARISON:  MRI 04/24/2018 FINDINGS: First lateral intraoperative image demonstrates posterior needle directed at the L3-4 level. Second lateral intraoperative image demonstrates posterior surgical instruments directed at the L4 pedicles. Third lateral intraoperative image demonstrates posterior surgical instruments at the L4-5 level. IMPRESSION: Intraoperative localization as above. Electronically Signed   By: Charlett NoseKevin  Dover M.D.   On: 05/13/2018 20:49    Assessment/Plan: Mobilized today with physical occupational therapy  LOS: 1 day     Dalan Cowger P 05/14/2018, 8:34 AM

## 2018-05-14 NOTE — Progress Notes (Signed)
Family concerned as patient was on ASA prior to surgery per Dr. Wynetta Emeryram but it was inadvertently listed as an allergy according to both son and daughter on the pre-op screening. They were extraordinarily concerned that this get "amended today". Discussed with family as Dr. Wynetta Emeryram already been in to see patient earlier. Will notify. Gabriel CirriBarbie Juanita Streight RN

## 2018-05-14 NOTE — Social Work (Signed)
CSW acknowledging consult for SNF placement. [facility], has supervision and assistance at home with family.   CSW signing off. Please consult if any additional needs arise.  Doy HutchingIsabel H Joyceann Kruser, LCSWA [facility] Clinical Social Work [phone]

## 2018-05-15 MED ORDER — METHOCARBAMOL 500 MG PO TABS: 500.0000 mg | ORAL_TABLET | Freq: Four times a day (QID) | ORAL | 0 refills | Status: AC

## 2018-05-15 MED ORDER — ASPIRIN 81 MG PO CHEW
81.0000 mg | CHEWABLE_TABLET | Freq: Every day | ORAL | Status: DC
  Administered 2018-05-15: 81 mg via ORAL
  Filled 2018-05-15: qty 1

## 2018-05-15 MED ORDER — ACETAMINOPHEN-CODEINE #3 300-30 MG PO TABS: 1.0000 | ORAL_TABLET | Freq: Every evening | ORAL | 0 refills | Status: AC | PRN

## 2018-05-15 MED FILL — Thrombin (Recombinant) For Soln 5000 Unit: CUTANEOUS | Qty: 2 | Status: AC

## 2018-05-15 NOTE — Progress Notes (Signed)
Occupational Therapy Treatment Patient Details Name: Ann Davidson MRN: [id number] DOB: 08/20/1931 Today's Date: 05/15/2018    History of present illness Patient is very pleasant 84 year old female is a progress worsening back pain and left leg pain with a left-sided foot drop workup revealed large disc herniation with a free fragment migrating caudally displace the left L5 nerve root in this left L5 pedicle. She also has severe spinal stenosis that level.   Lumbar laminectomy microdiscectomy L4-5 on the left with microdissection of left L5 nerve root and microscopic discectomy on 12/4.  PMH: IBS   OT comments  Pt. Seen for skilled OT session.  Focus on bed mobility, LB ADLs.  Moving well and able to complete at S/Min guard A.  Very eager to return home.  Reports a dtr. Lives with them and has another dtr. Near by to assist as needed.   Follow Up Recommendations  Supervision/Assistance - 24 hour;No OT follow up    Equipment Recommendations  3 in 1 bedside commode    Recommendations for Other Services      Precautions / Restrictions Precautions Precautions: Fall;Back Restrictions Weight Bearing Restrictions: No       Mobility Bed Mobility Overal bed mobility: Needs Assistance Bed Mobility: Rolling;Sidelying to Sit Rolling: Supervision Sidelying to sit: Supervision       General bed mobility comments: Pt needed cues for log roll.  Husband present and educated. (hob flat, no rail)  Transfers Overall transfer level: Needs assistance Equipment used: 1 person hand held assist Transfers: Sit to/from [employer] Sit to Stand: Min guard Stand pivot transfers: Min guard            Balance                                           ADL either performed or assessed with clinical judgement   ADL Overall ADL's : Needs assistance/impaired                     Lower Body Dressing: Set up;Sit to/from stand;Min guard Lower Body Dressing  Details (indicate cue type and reason): able to cross each leg over to don under wear, then sit/stand to pull up Toilet Transfer: Min Pension scheme manager Details (indicate cue type and reason): simulated with eob approx. 5 steps to chair in room. (declined b.room, had gone before my arrival) Toileting- Architect and Hygiene: Min guard;Sit to/from stand Toileting - Clothing Manipulation Details (indicate cue type and reason): simulated during in room activities     Functional mobility during ADLs: Min guard       Vision       Perception     Praxis      Cognition Arousal/Alertness: Awake/alert Behavior During Therapy: WFL for tasks assessed/performed Overall Cognitive Status: Within Functional Limits for tasks assessed                                          Exercises     Shoulder Instructions       General Comments      Pertinent Vitals/ Pain       Pain Assessment: No/denies pain  Home Living  Prior Functioning/Environment              Frequency  Min 2X/week        Progress Toward Goals  OT Goals(current goals can now be found in the care plan section)  Progress towards OT goals: Progressing toward goals     Plan      Co-evaluation                 AM-PAC OT "6 Clicks" Daily Activity     Outcome Measure   Help from another person eating meals?: None Help from another person taking care of personal grooming?: A Little Help from another person toileting, which includes using toliet, bedpan, or urinal?: A Little Help from another person bathing (including washing, rinsing, drying)?: A Little Help from another person to put on and taking off regular upper body clothing?: A Little Help from another person to put on and taking off regular lower body clothing?: A Little 6 Click Score: 19    End of Session    OT Visit Diagnosis: Unsteadiness on feet  (R26.81);Other abnormalities of gait and mobility (R26.89);Muscle weakness (generalized) (M62.81);Pain   Activity Tolerance Patient tolerated treatment well   Patient Left in chair;with call bell/phone within reach;with family/visitor present   Nurse Communication          Time: [id number] OT Time Calculation (min): 15 min  Charges: OT General Charges $OT Visit: 1 Visit OT Treatments $Self Care/Home Management : 8-22 mins   Robet LeuMorris, Sherlynn Tourville [place]raine, COTA/L 05/15/2018, 9:03 AM

## 2018-05-15 NOTE — Care Management Note (Signed)
Case Management Note  Patient Details  Name: Ann Davidson MRN: [id number] Date of Birth: [birth year]  Subjective/Objective:  Patient is very pleasant 84 year old female is a progress worsening back pain and left leg pain with a left-sided foot drop workup revealed large disc herniation with a free fragment migrating caudally displace the left L5 nerve root in this left L5 pedicle. She also has severe spinal stenosis that level.   Lumbar laminectomy microdiscectomy L4-5 on the left with microdissection of left L5 nerve root and microscopic discectomy on 12/4.  PTA, pt independent, lives at home with spouse.                  Action/Plan: PT recommending HH follow up, DME for home.  Pt and spouse politely decline HH services; state pt has been doing OP PT for a year and a half, and would like to continue with this.  She declines any ordering of DME, as she states her daughter has arranged for all needed equipment at home.    Expected Discharge Date:  05/15/18               Expected Discharge Plan:  Home w Home Health Services  In-House Referral:     Discharge planning Services  CM Consult  Post Acute Care Choice:    Choice offered to:     DME Arranged:    DME Agency:     HH Arranged:  Patient Refused HH HH Agency:     Status of Service:  Completed, signed off  If discussed at [employer] Length of Stay Meetings, dates discussed:    Additional Comments:  Quintella BatonJulie W. Avid Guillette, RN, BSN  Trauma/Neuro ICU Case Manager [phone]

## 2018-05-15 NOTE — Discharge Summary (Signed)
Physician Discharge Summary  Patient ID: Ann Davidson MRN: [id number] DOB/AGE: [birth year] 84 y.o.  Admit date: 05/13/2018 Discharge date: 05/15/2018  Admission Diagnoses: Left L5 radiculopathies from herniated mucous pulposis L4-5 left as well as lumbar spondylosis stenosis L4-5   Discharge Diagnoses: same   Discharged Condition: good  Hospital Course: The patient was admitted on 05/13/2018 and taken to the operating room where the patient underwent L4-5 microdiskectomy. The patient tolerated the procedure well and was taken to the recovery room and then to the floor in stable condition. The hospital course was routine. There were no complications. The wound remained clean dry and intact. Pt had appropriate back soreness. No complaints of leg pain or new N/T/W. The patient remained afebrile with stable vital signs, and tolerated a regular diet. The patient continued to increase activities, and pain was well controlled with oral pain medications.   Consults: None  Significant Diagnostic Studies:  Results for orders placed or performed during the hospital encounter of 05/11/18  Surgical pcr screen  Result Value Ref Range   MRSA, PCR NEGATIVE NEGATIVE   Staphylococcus aureus NEGATIVE NEGATIVE    Dg Lumbar Spine 2-3 Views  Result Date: 05/13/2018 CLINICAL DATA:  Localization for L4-5 microdiscectomy EXAM: LUMBAR SPINE - 2-3 VIEW COMPARISON:  MRI 04/24/2018 FINDINGS: First lateral intraoperative image demonstrates posterior needle directed at the L3-4 level. Second lateral intraoperative image demonstrates posterior surgical instruments directed at the L4 pedicles. Third lateral intraoperative image demonstrates posterior surgical instruments at the L4-5 level. IMPRESSION: Intraoperative localization as above. Electronically Signed   By: Charlett Nose M.D.   On: 05/13/2018 20:49    Antibiotics:  Anti-infectives (From admission, onward)   Start     Dose/Rate Route Frequency Ordered Stop   05/14/18 1700  vancomycin (VANCOCIN) IVPB 750 mg/150 ml premix     750 mg 150 mL/hr over 60 Minutes Intravenous  Once 05/13/18 2202 05/14/18 1953   05/13/18 1814  bacitracin 50,000 Units in sodium chloride 0.9 % 500 mL irrigation  Status:  Discontinued       As needed 05/13/18 1815 05/13/18 1930   05/13/18 1803  vancomycin (VANCOCIN) 1-5 GM/200ML-% IVPB    Note to Pharmacy:  Sandi Raveling   : cabinet override      05/13/18 1803 05/14/18 0614      Discharge Exam: Blood pressure 137/62, pulse 73, temperature 98.6 F (37 C), temperature source Oral, resp. rate 13, height 5\' 3"  (1.6 m), weight 47.2 kg, SpO2 98 %. Neurologic: Grossly normal ambualtling voiding well, incision CDI  Discharge Medications:   Allergies as of 05/15/2018      Reactions   Cyclobenzaprine    Mouth swollen, skin peeled, dry mouth   Penicillins Swelling   Has patient had a PCN reaction causing immediate rash, facial/tongue/throat swelling, SOB or lightheadedness with hypotension: Unknown Has patient had a PCN reaction causing severe rash involving mucus membranes or skin necrosis: Unknown Has patient had a PCN reaction that required hospitalization: Unknown Has patient had a PCN reaction occurring within the last 10 years: No If all of the above answers are "NO", then may proceed with Cephalosporin use.   Tramadol Other (See Comments)   Made blood pressure go high   Codeine    When taken before bed, causes confusion when woken up      Medication List    TAKE these medications   acetaminophen 500 MG tablet Commonly known as:  TYLENOL Take 1,000 mg by mouth 2 (two) times daily as  needed for moderate pain.   acetaminophen-codeine 300-30 MG tablet Commonly known as:  TYLENOL #3 Take 1 tablet by mouth at bedtime as needed for moderate pain.   amLODipine 5 MG tablet Commonly known as:  NORVASC Take 5 mg by mouth daily as needed (if bp is over 170).   aspirin EC 81 MG tablet Take 81 mg by mouth daily.    atorvastatin 10 MG tablet Commonly known as:  LIPITOR Take 10 mg by mouth daily.   calcium carbonate 500 MG chewable tablet Commonly known as:  TUMS - dosed in mg elemental calcium Chew 2 tablets by mouth 2 (two) times daily as needed for indigestion or heartburn.   carvedilol 25 MG tablet Commonly known as:  COREG Take 12.5 mg by mouth 2 (two) times daily.   hyoscyamine 0.125 MG Tbdp disintergrating tablet Commonly known as:  ANASPAZ Take 0.125 mg by mouth 2 (two) times daily as needed for spasms.   methocarbamol 500 MG tablet Commonly known as:  ROBAXIN Take 1 tablet (500 mg total) by mouth 4 (four) times daily.   PEPCID AC 10 MG tablet Generic drug:  famotidine Take 10 mg by mouth daily as needed for heartburn or indigestion.   PRESERVISION AREDS PO Take 1 tablet by mouth 2 (two) times daily.   trolamine salicylate 10 % cream Commonly known as:  ASPERCREME Apply 1 application topically as needed for muscle pain.       Disposition: home   Final Dx: l4-5 microdiskectomy  Discharge Instructions     Remove dressing in 72 hours   Complete by:  As directed    Call MD for:  difficulty breathing, headache or visual disturbances   Complete by:  As directed    Call MD for:  hives   Complete by:  As directed    Call MD for:  persistant dizziness or light-headedness   Complete by:  As directed    Call MD for:  persistant nausea and vomiting   Complete by:  As directed    Call MD for:  redness, tenderness, or signs of infection (pain, swelling, redness, odor or green/yellow discharge around incision site)   Complete by:  As directed    Call MD for:  severe uncontrolled pain   Complete by:  As directed    Call MD for:  temperature >100.4   Complete by:  As directed    Diet - low sodium heart healthy   Complete by:  As directed    Driving Restrictions   Complete by:  As directed    No driving for 2 weeks, no riding in the car for 1 week   Increase activity slowly    Complete by:  As directed    Lifting restrictions   Complete by:  As directed    No lifting more than 8 lbs         Signed: Tiana LoftKimberly Hannah Junior Kenedy 05/15/2018, 9:25 AM

## 2018-05-15 NOTE — Care Management Note (Signed)
Case Management Note  Patient Details  Name: Ann Davidson MRN: [id number] Date of Birth: Feb 15, 1932  Subjective/Objective:  Patient is very pleasant 84 year old female is a progress worsening back pain and left leg pain with a left-sided foot drop workup revealed large disc herniation with a free fragment migrating caudally displace the left L5 nerve root in this left L5 pedicle. She also has severe spinal stenosis that level.   Lumbar laminectomy microdiscectomy L4-5 on the left with microdissection of left L5 nerve root and microscopic discectomy on 12/4.  PTA, pt independent, lives at home with spouse.                  Action/Plan: PT recommending HH follow up, DME for home.  Pt and spouse politely decline HH services; state pt has been doing OP PT for a year and a half, and would like to continue with this.  She declines any ordering of DME, as she states her daughter has arranged for all needed equipment at home.    Addendum:  When daughter arrived, she stated she had not obtained DME for home, and pt will need RW and 3 in 1 for home.  She would like pt to have HH provided by [facility] in [place], [place], which was not on Medicare. gov list (family preference).  [facility] [place] [facility]. Fax [phone].  Expected Discharge Date:  05/15/18               Expected Discharge Plan:  Home w Home Health Services  In-House Referral:     Discharge planning Services  CM Consult  Post Acute Care Choice:  Home Health Choice offered to:  Adult Children  DME Arranged:  3-N-1, Walker rolling DME Agency:  [facility]  HH Arranged:  PT HH Agency:  Other - See comment  Status of Service:  Completed, signed off  If discussed at Long Length of Stay Meetings, dates discussed:    Additional Comments:  Quintella BatonJulie W. Jaysie Benthall, RN, BSN  Trauma/Neuro ICU Case Manager [phone]

## 2018-05-15 NOTE — Progress Notes (Signed)
Physical Therapy Treatment Patient Details Name: Ann Davidson MRN: [id number] DOB: 07/16/1931 Today's Date: 05/15/2018    History of Present Illness Patient is very pleasant 84 year old female L4/5 laminectomy and discectomy on 12/4.  PMH: IBS, TIA, orthostatic hypotension, HTN, HLD, cognitive impairments     PT Comments    Pt progressing well towards goals. Pt presents with short term memory deficits, decreased attention, decreased awareness and generalized weakness limiting her ability to follow her back precautions, perform transfers and amb safely and independently. Pt amb 200 ft with RW and 10 feet with HH assist in room VSS and no c/o of fatigue or pain. Pt requires repeated, succinct VC to maintain precautions throughout mobility. Pt will benefit from skilled PT to increase her independence and safety with mobility to allow [facility] and family support.   Follow Up Recommendations  Home health PT;Supervision/Assistance - 24 hour     Equipment Recommendations  3in1 (PT);Rolling walker with 5" wheels    Recommendations for Other Services       Precautions / Restrictions Precautions Precautions: None;Back Precaution Comments: Pt can remember not to pick things up off the floor, sweep or lift anything but cannot remember no "bending, lifting and twisting" when asked what her back precautions are.  Restrictions Weight Bearing Restrictions: No    Mobility  Bed Mobility Overal bed mobility: Needs Assistance Bed Mobility: Rolling Rolling: Supervision Sidelying to sit: Supervision       General bed mobility comments: Performed log roll without cuing, maintained back precautions for supine to sit  Transfers Overall transfer level: Needs assistance Equipment used: 1 person hand held assist;Rolling walker (2 wheeled) Transfers: Sit to/from Stand Sit to Stand: Min guard;Min assist         General transfer comment: First sit to stand with RW, repetitive  cuing to push from bed instead of pulling on RW. Second STS with one hand held assist, pt pushed through hand to power to stand and for stability. Maintained back precautions throughout.  Ambulation/Gait Ambulation/Gait assistance: Min guard;Min assist Gait Distance (Feet): 210 Feet Assistive device: Rolling walker (2 wheeled) Gait Pattern/deviations: Decreased stride length;Step-through pattern Gait velocity: decreased Gait velocity interpretation: <1.8 ft/sec, indicate of risk for recurrent falls General Gait Details: Consistently attempts to twist to talk to PT, needs VC to look forward and stand straight to maintain precautions. Needs VC to prevent looking at feet when navigating hospital room. VC to navigate turns with RW. Stays within RW when amb straight. Amb 10 feet in room with HH assit, min assist for mild LOB when turning to back up to recliner.    Stairs             Wheelchair Mobility    Modified Rankin (Stroke Patients Only)       Balance Overall balance assessment: Needs assistance Sitting-balance support: No upper extremity supported;Feet supported Sitting balance-Leahy Scale: Good     Standing balance support: Bilateral upper extremity supported;During functional activity;Single extremity supported Standing balance-Leahy Scale: Poor Standing balance comment: requires UE support for standing                            Cognition Arousal/Alertness: Awake/alert Behavior During Therapy: WFL for tasks assessed/performed Overall Cognitive Status: History of cognitive impairments - at baseline Area of Impairment: Attention;Memory;Safety/judgement;Following commands;Awareness;Orientation                 Orientation Level: Disoriented to;Place;Situation Current Attention  Level: Sustained Memory: Decreased recall of precautions;Decreased short-term memory Following Commands: Follows one step commands consistently;Follows multi-step commands  inconsistently;Follows one step commands with increased time Safety/Judgement: Decreased awareness of safety;Decreased awareness of deficits Awareness: Intellectual   General Comments: Pt's husband states this is new, but his history telling may not be reliable.       Exercises      General Comments General comments (skin integrity, edema, etc.): Pt unable to recall BLT, can recall specific household activities she is not supposed to perform. Husband present throughout session.      Pertinent Vitals/Pain Pain Assessment: No/denies pain Faces Pain Scale: Hurts a little bit Pain Location: incision Pain Descriptors / Indicators: Discomfort Pain Intervention(s): Monitored during session    Home Living Family/patient expects to be discharged to:: Private residence Living Arrangements: Spouse/significant other Available Help at Discharge: Family;Available 24 hours/day Type of Home: House Home Access: Stairs to enter            Prior Function            PT Goals (current goals can now be found in the care plan section) Acute Rehab PT Goals Patient Stated Goal: to go home PT Goal Formulation: With patient Time For Goal Achievement: 05/28/18 Potential to Achieve Goals: Good    Frequency    Min 5X/week      PT Plan      Co-evaluation              AM-PAC PT "6 Clicks" Mobility   Outcome Measure  Help needed turning from your back to your side while in a flat bed without using bedrails?: None Help needed moving from lying on your back to sitting on the side of a flat bed without using bedrails?: A Little Help needed moving to and from a bed to a chair (including a wheelchair)?: A Little Help needed standing up from a chair using your arms (e.g., wheelchair or bedside chair)?: A Little Help needed to walk in hospital room?: A Little Help needed climbing 3-5 steps with a railing? : A Lot 6 Click Score: 18    End of Session Equipment Utilized During  Treatment: Gait belt Activity Tolerance: Patient tolerated treatment well Patient left: in chair;with call bell/phone within reach;with family/visitor present Nurse Communication: Mobility status PT Visit Diagnosis: Unsteadiness on feet (R26.81);Muscle weakness (generalized) (M62.81)     Time: 6578-4696 PT Time Calculation (min) (ACUTE ONLY): 32 min  Charges:  $Therapeutic Activity: 23-37 mins                     Pattricia Boss, SPT Acute Rehab Services [phone]    Pattricia Boss 05/15/2018, 4:14 PM

## 2018-05-15 NOTE — Anesthesia Postprocedure Evaluation (Signed)
Anesthesia Post Note  Patient: Ann Davidson  Procedure(s) Performed: Microdiscectomy - L4-L5 - left (Left Back)     Patient location during evaluation: PACU Anesthesia Type: General Level of consciousness: awake and alert Pain management: pain level controlled Vital Signs Assessment: post-procedure vital signs reviewed and stable Respiratory status: spontaneous breathing, nonlabored ventilation, respiratory function stable and patient connected to nasal cannula oxygen Cardiovascular status: blood pressure returned to baseline and stable Postop Assessment: no apparent nausea or vomiting Anesthetic complications: no    Last Vitals:  Vitals:   05/15/18 0400 05/15/18 0732  BP:    Pulse:    Resp:    Temp: 36.9 C 37 C  SpO2:      Last Pain:  Vitals:   05/15/18 0732  TempSrc: Oral  PainSc:                  Artemisia Auvil S

## 2018-05-15 NOTE — Care Management Important Message (Signed)
Important Message  Patient Details  Name: Ann Davidson MRN: [id number] Date of Birth: 1931-08-13   Medicare Important Message Given:  Yes    Dorena BodoIris Kinsleigh Ludolph 05/15/2018, 4:21 PM

## 2018-05-18 ENCOUNTER — Telehealth: Payer: Self-pay | Admitting: Neurology

## 2018-05-18 ENCOUNTER — Telehealth: Payer: Self-pay | Admitting: *Deleted

## 2018-05-18 ENCOUNTER — Encounter: Payer: Self-pay | Admitting: *Deleted

## 2018-05-18 NOTE — Telephone Encounter (Addendum)
Spoke with pt's daughter Ann Davidson. She stated that they have not called the surgeon yet however pt was seen today by a nurse (with home health) who will contact him and ask for order to r/o UTI. Pt gets nauseated when she first wakes up and then gets disoriented. She stated pt is not taking Tylenol #3 because she didn't like it. She also only took one dose of the Robaxin on Saturday morning. Pt extremely anxious and is scared that something will happen to her. She is the last one in her family. The last of her siblings passed away in February. She does well when the nurse is there. RN advised for pt's daughter to call the surgeon and discuss and pcp to r/o infection. Advised of stroke symptom examples and advised her to call 911 immediately and get to hospital if patient has these. Pt also needs MRI done. Daughter will call GI to schedule. She wanted pt to heal a little more first. She had no further questions at this time.

## 2018-05-18 NOTE — Care Management (Signed)
CM contacted by [facility](Janice) - order received on Friday 05/15/18 did not have MD signature - per medicare guidelines orders must have MD signature. Orders faxed 05/15/18 has a NP signature only.  CM promptly contacted neuro group and requested callback regarding pt .  HH agency to follow up with PCP and request orders.  Pt discharged over the weekend

## 2018-05-18 NOTE — Telephone Encounter (Signed)
I would talk to surgeon. She is on tylenol #3 and on robaxin which may affect her. Also she should be seen by pcp to ensure no infections and schedule the MRI of the brain to ensure no strokes. thanks

## 2018-05-18 NOTE — Telephone Encounter (Signed)
Pt daughter(on DPR-Duarte,Sherri) is asking for a call to discuss the behavior of pt, she states her behavior has been bizarre since the surgery.  Daughter is asking for a call from Dr Lucia GaskinsAhern, she was offered an appointment but declined stating she'd like to hear from Dr Lucia GaskinsAhern first.

## 2018-05-19 NOTE — Care Management (Signed)
05/19/18 CM spoke with neurology PA Leo GrosserKim Meyran and she will speak with neurology MD Dr. Wynetta Emeryram about ordering/signing HH orders for pt.  Service confirmed they will handle remainder of process

## 2018-06-23 ENCOUNTER — Telehealth: Payer: Self-pay | Admitting: Neurology

## 2018-06-23 NOTE — Telephone Encounter (Signed)
Pt daughter(on DPR-Duarte,Sherri) has called to inform pt has not had MRI but is wanting to know if Dr Lucia Gaskins wants it done before 3 month f/u on 2-19

## 2018-06-23 NOTE — Telephone Encounter (Signed)
I returned the call to her daughter on [place].  States her mother had back surgery in December 2019.  They would like to proceed with the ordered MRA head and MRI brain.  She was provided the number to [facility] [phone]) and will call to make the appts.

## 2018-07-03 ENCOUNTER — Other Ambulatory Visit: Payer: Medicare Other

## 2018-07-12 ENCOUNTER — Ambulatory Visit
Admission: RE | Admit: 2018-07-12 | Discharge: 2018-07-12 | Disposition: A | Discharge status: Home / Self Care | Payer: Medicare Other | Source: Ambulatory Visit | Attending: Neurology | Admitting: Neurology

## 2018-07-12 DIAGNOSIS — G459 Transient cerebral ischemic attack, unspecified: Secondary | ICD-10-CM

## 2018-07-14 ENCOUNTER — Telehealth: Payer: Self-pay | Admitting: *Deleted

## 2018-07-14 ENCOUNTER — Other Ambulatory Visit: Payer: Medicare Other

## 2018-07-14 NOTE — Telephone Encounter (Signed)
I called the pt's daughter Arvilla MarketSherri Duarte and LVM (ok per DPR) advising that neither MRA nor MRI brain showed anything to explain patient's symptoms. There were no strokes or anything acute. Reminded her of pt's appt with Shanda BumpsJessica NP on 07/29/2018 @ 3:15 and advised that Shanda BumpsJessica will be able to answer any questions and/or review scan with them at the appt. However if she has any questions in the meantime she is welcome to call back. I left office number in message.

## 2018-07-14 NOTE — Telephone Encounter (Signed)
-----   Message from Anson Fret, MD sent at 07/14/2018 12:31 PM EST ----- Nothing to explain her symptoms, no strokes or anything acute.

## 2018-07-15 NOTE — Telephone Encounter (Signed)
I spoke with pt's daughter Roanna Raider and gave her Dr. Trevor Mace message. She verbalized appreciation and will f/u as needed. Feb appt with Shanda Bumps canceled.

## 2018-07-15 NOTE — Telephone Encounter (Signed)
I returned Sherri's call. Discussed results again. She verbalized understanding and appreciation. She asked, since nothing was found on the MRI, is the Feb follow-up needed? She stated pt seems to be doing well and has not had anymore spells. I advised that I would speak with Dr. Lucia GaskinsAhern and call her back with f/u suggestion.

## 2018-07-15 NOTE — Telephone Encounter (Signed)
No if she is doing well no need to follow up, just follow up as needed. thanks

## 2018-07-15 NOTE — Telephone Encounter (Signed)
Pts daughter Arvilla Market called back. RN was not available. Please call back as soon as available.

## 2018-07-16 NOTE — Telephone Encounter (Signed)
Noted! Thank you

## 2018-07-29 ENCOUNTER — Ambulatory Visit: Payer: Medicare Other | Admitting: Adult Health

## 2019-11-16 IMAGING — CR DG LUMBAR SPINE 2-3V
3 series · 3 of 3 positions shown · non-contrast
Comparison: MRI 04/24/2018

CLINICAL DATA: Localization for L4-5 microdiscectomy

EXAM:
LUMBAR SPINE - 2-3 VIEW

[xtable lateral (1 of 3)]
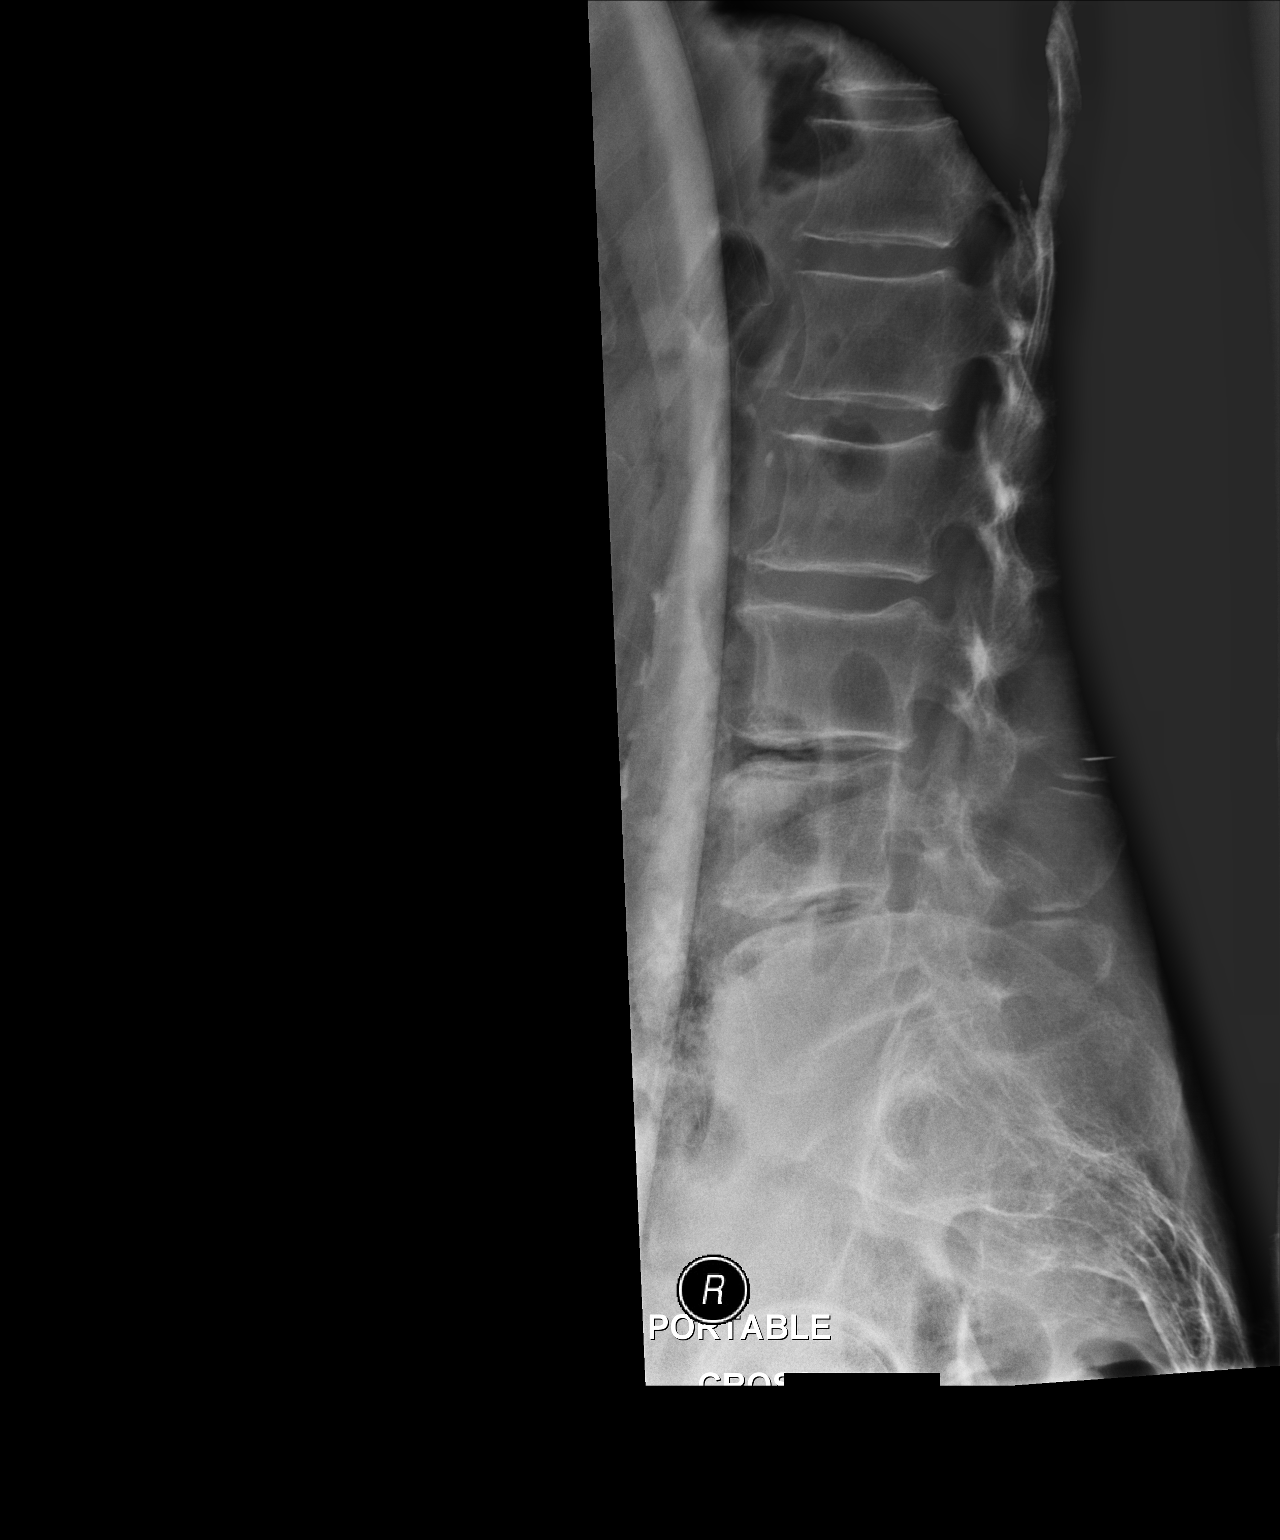

[xtable lateral (2 of 3)]
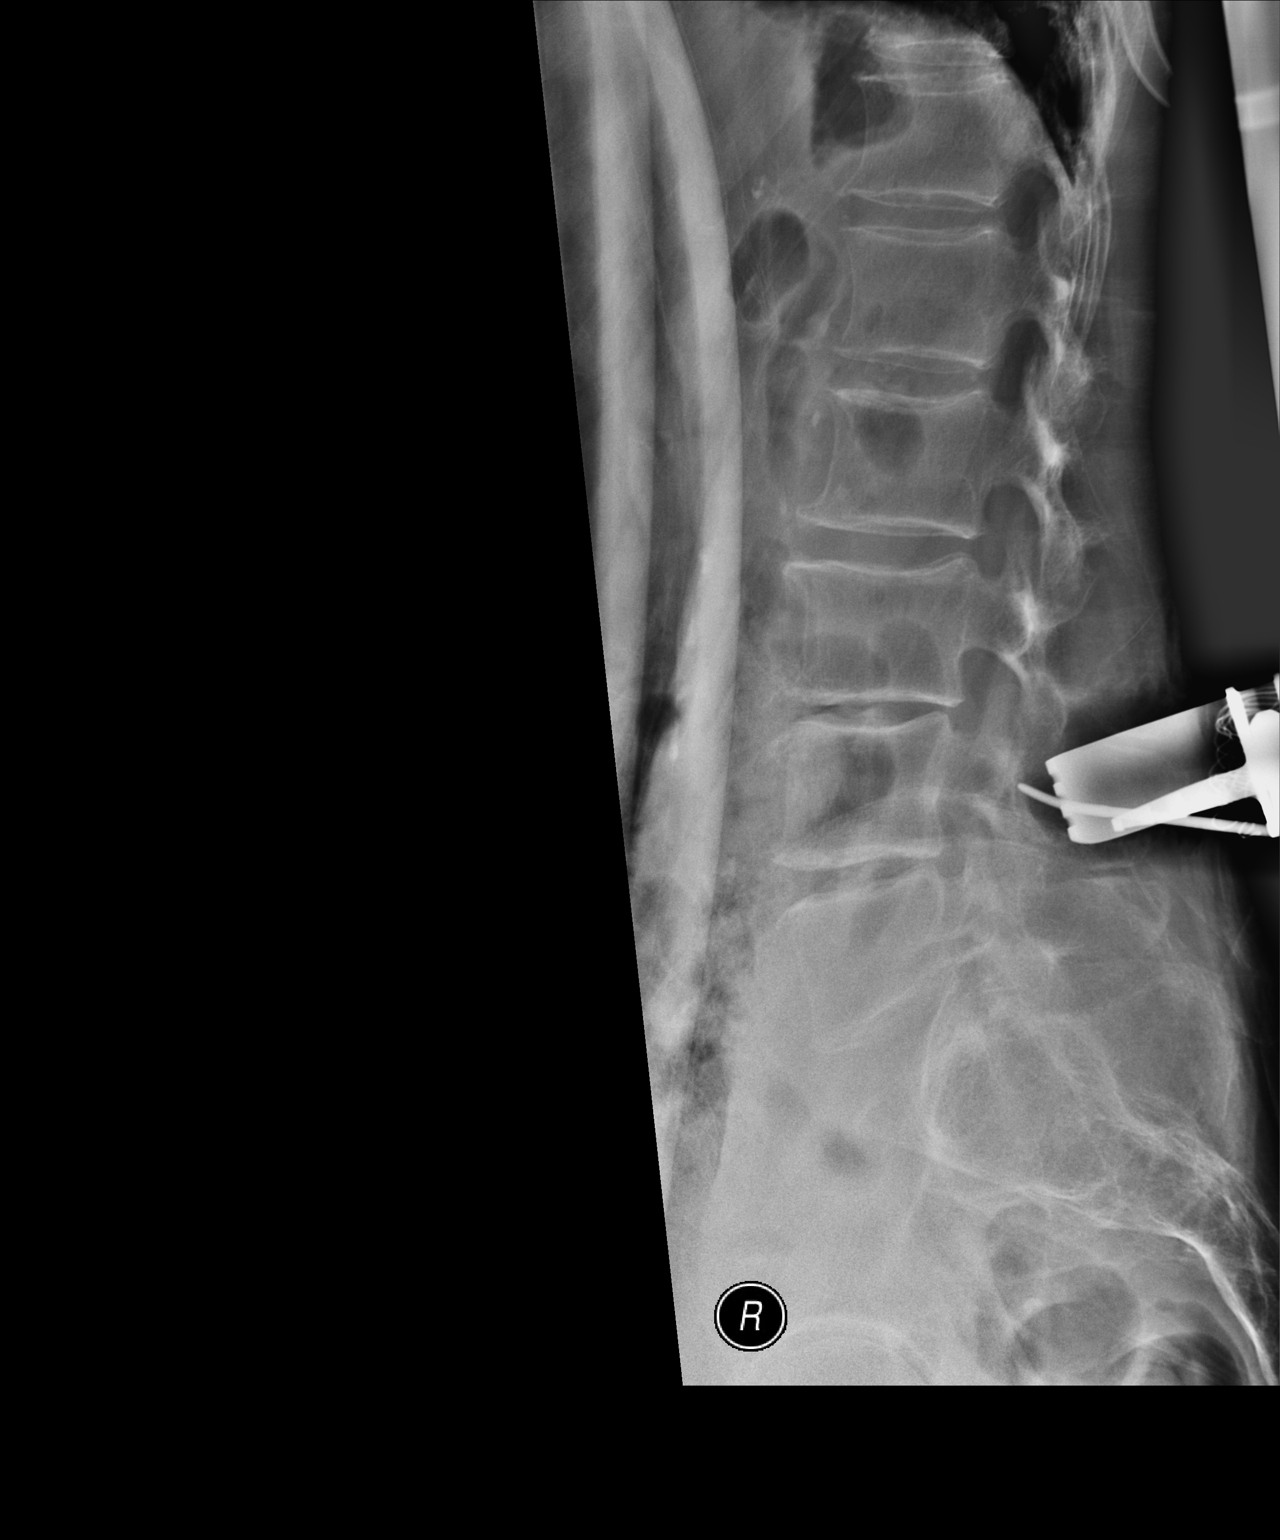

[xtable lateral (3 of 3)]
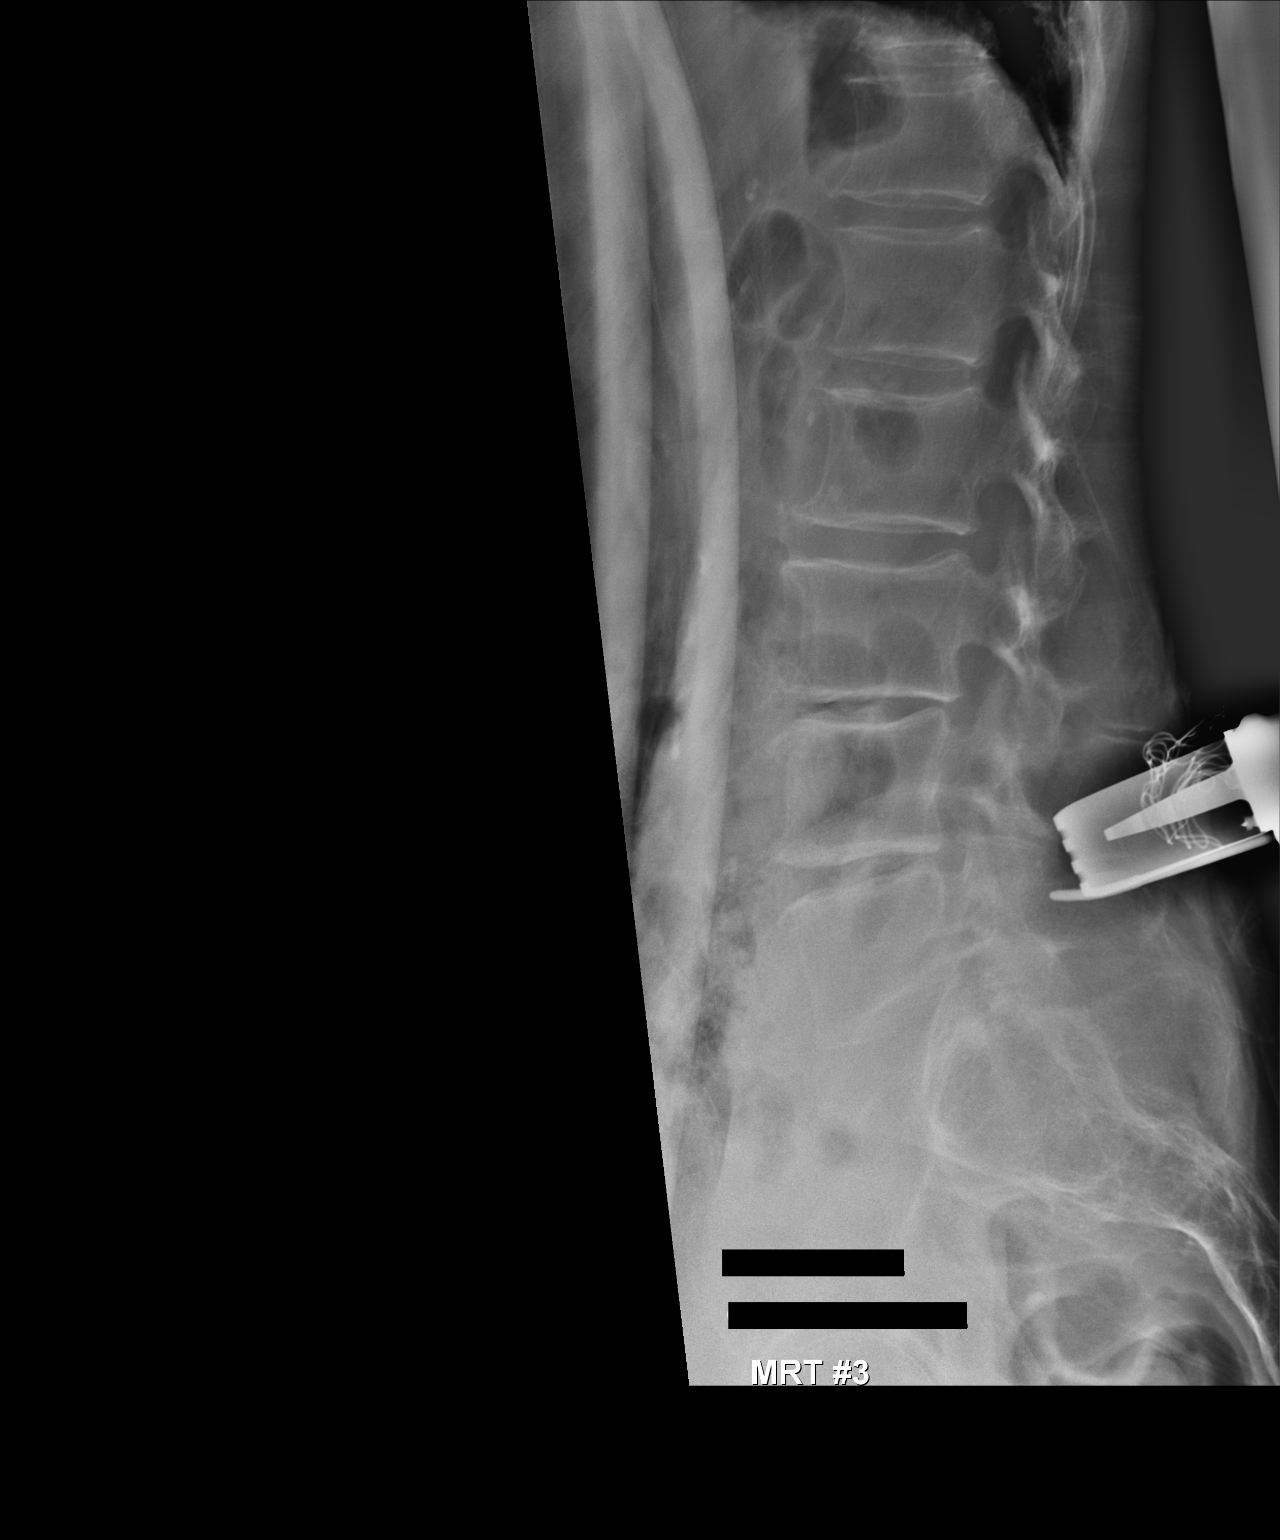

[3 of 3 positions shown; findings below may reference images not displayed]

FINDINGS: First lateral intraoperative image demonstrates posterior needle
directed at the L3-4 level.

Second lateral intraoperative image demonstrates posterior surgical
instruments directed at the L4 pedicles.

Third lateral intraoperative image demonstrates posterior surgical
instruments at the L4-5 level.
IMPRESSION: Intraoperative localization as above.

## 2020-02-11 ENCOUNTER — Telehealth: Payer: Self-pay | Admitting: Neurology

## 2020-02-11 NOTE — Telephone Encounter (Signed)
Pt's daughter Arvilla Market, on [place] called wanting to speak to RN about some concerns she has on the pt. She states that after the back surgery the pt had she has been having night sweats and brain fog and she is wanting to know if this was caused by the surgery or if it is because of her age. Please advise.

## 2020-02-11 NOTE — Telephone Encounter (Signed)
error 

## 2020-02-12 NOTE — Telephone Encounter (Addendum)
Ann Davidson, Let patient know we cannot address this over the phone. Ask her to make an appointment with her primary care to discuss in person(they will not discuss over the phone either), she should see her primary care not just call them. Primary care should ensure there are no other reasons for her symptoms, so please ask her to actually go an see her primary care as they will not discuss this over the phone either(please emphasize)  thanks

## 2021-07-12 ENCOUNTER — Telehealth: Payer: Self-pay | Admitting: Neurology

## 2021-07-12 NOTE — Telephone Encounter (Signed)
Pt's daughter, Arvilla Market ( on [place]) requesting CD copy of MRIs, brain scans and any imaging that was done for 2019. [facility], [place] needs this information. Would like a call from Medical Records. Contact info: [phone]

## 2021-07-12 NOTE — Telephone Encounter (Signed)
I spoke to the patient daughter, she is going to call [facility] to get copies of cds.

## 2021-10-08 DEATH — deceased
